# Patient Record
Sex: Male | Born: 1980
Health system: Southern US, Community
[De-identification: ages and names within clinical notes are randomized; demographics above are authoritative.]

## PROBLEM LIST (undated history)

## (undated) DIAGNOSIS — E039 Hypothyroidism, unspecified: Secondary | ICD-10-CM

## (undated) DIAGNOSIS — K219 Gastro-esophageal reflux disease without esophagitis: Secondary | ICD-10-CM

## (undated) DIAGNOSIS — G43909 Migraine, unspecified, not intractable, without status migrainosus: Secondary | ICD-10-CM

## (undated) DIAGNOSIS — E215 Disorder of parathyroid gland, unspecified: Secondary | ICD-10-CM

## (undated) DIAGNOSIS — Z21 Asymptomatic human immunodeficiency virus [HIV] infection status: Secondary | ICD-10-CM

## (undated) DIAGNOSIS — Z9889 Other specified postprocedural states: Secondary | ICD-10-CM

## (undated) DIAGNOSIS — R112 Nausea with vomiting, unspecified: Secondary | ICD-10-CM

## (undated) DIAGNOSIS — E7800 Pure hypercholesterolemia, unspecified: Secondary | ICD-10-CM

## (undated) DIAGNOSIS — D849 Immunodeficiency, unspecified: Secondary | ICD-10-CM

## (undated) DIAGNOSIS — G40909 Epilepsy, unspecified, not intractable, without status epilepticus: Secondary | ICD-10-CM

## (undated) DIAGNOSIS — B20 Human immunodeficiency virus [HIV] disease: Principal | ICD-10-CM

## (undated) DIAGNOSIS — N189 Chronic kidney disease, unspecified: Secondary | ICD-10-CM

## (undated) DIAGNOSIS — I10 Essential (primary) hypertension: Secondary | ICD-10-CM

## (undated) HISTORY — DX: Pure hypercholesterolemia, unspecified: E78.00

## (undated) HISTORY — DX: Gastro-esophageal reflux disease without esophagitis: K21.9

## (undated) HISTORY — DX: Epilepsy, unspecified, not intractable, without status epilepticus: G40.909

## (undated) HISTORY — PX: AV FISTULA PLACEMENT: SHX1204

## (undated) HISTORY — DX: Disorder of parathyroid gland, unspecified: E21.5

## (undated) HISTORY — DX: Migraine, unspecified, not intractable, without status migrainosus: G43.909

## (undated) HISTORY — DX: Hypothyroidism, unspecified: E03.9

## (undated) HISTORY — DX: Human immunodeficiency virus (HIV) disease: B20

---

## 2015-09-09 ENCOUNTER — Other Ambulatory Visit: Payer: Self-pay

## 2015-09-09 ENCOUNTER — Other Ambulatory Visit: Payer: Medicare Other

## 2015-09-09 DIAGNOSIS — Z111 Encounter for screening for respiratory tuberculosis: Secondary | ICD-10-CM

## 2015-09-09 DIAGNOSIS — Z79899 Other long term (current) drug therapy: Secondary | ICD-10-CM

## 2015-09-09 DIAGNOSIS — B20 Human immunodeficiency virus [HIV] disease: Secondary | ICD-10-CM

## 2015-09-09 DIAGNOSIS — Z113 Encounter for screening for infections with a predominantly sexual mode of transmission: Secondary | ICD-10-CM

## 2015-09-09 LAB — CBC WITH DIFFERENTIAL/PLATELET
BASOS PCT: 0 %
Basophils Absolute: 0 cells/uL (ref 0–200)
EOS PCT: 3 %
Eosinophils Absolute: 186 cells/uL (ref 15–500)
HEMATOCRIT: 41.1 % (ref 38.5–50.0)
HEMOGLOBIN: 13.7 g/dL (ref 13.2–17.1)
LYMPHS ABS: 1798 {cells}/uL (ref 850–3900)
Lymphocytes Relative: 29 %
MCH: 30.7 pg (ref 27.0–33.0)
MCHC: 33.3 g/dL (ref 32.0–36.0)
MCV: 92.2 fL (ref 80.0–100.0)
MONO ABS: 310 {cells}/uL (ref 200–950)
MPV: 10 fL (ref 7.5–12.5)
Monocytes Relative: 5 %
NEUTROS ABS: 3906 {cells}/uL (ref 1500–7800)
NEUTROS PCT: 63 %
Platelets: 158 10*3/uL (ref 140–400)
RBC: 4.46 MIL/uL (ref 4.20–5.80)
RDW: 19.2 % — ABNORMAL HIGH (ref 11.0–15.0)
WBC: 6.2 10*3/uL (ref 3.8–10.8)

## 2015-09-10 ENCOUNTER — Telehealth: Payer: Self-pay | Admitting: *Deleted

## 2015-09-10 LAB — HEPATITIS B SURFACE ANTIBODY,QUALITATIVE: Hep B S Ab: POSITIVE — AB

## 2015-09-10 LAB — COMPLETE METABOLIC PANEL WITH GFR
ALT: 15 U/L (ref 9–46)
AST: 19 U/L (ref 10–40)
Albumin: 4.7 g/dL (ref 3.6–5.1)
Alkaline Phosphatase: 319 U/L — ABNORMAL HIGH (ref 40–115)
BUN: 46 mg/dL — AB (ref 7–25)
CALCIUM: 8.6 mg/dL (ref 8.6–10.3)
CO2: 20 mmol/L (ref 20–31)
CREATININE: 12.15 mg/dL — AB (ref 0.60–1.35)
Chloride: 95 mmol/L — ABNORMAL LOW (ref 98–110)
GFR, Est African American: 5 mL/min — ABNORMAL LOW (ref >=60)
GFR, Est Non African American: 5 mL/min — ABNORMAL LOW (ref >=60)
GLUCOSE: 63 mg/dL — AB (ref 65–99)
POTASSIUM: 4.1 mmol/L (ref 3.5–5.3)
SODIUM: 137 mmol/L (ref 135–146)
TOTAL PROTEIN: 9.3 g/dL — AB (ref 6.1–8.1)
Total Bilirubin: 0.4 mg/dL (ref 0.2–1.2)

## 2015-09-10 LAB — LIPID PANEL
CHOLESTEROL: 206 mg/dL — AB (ref 125–200)
HDL: 38 mg/dL — AB (ref >=40)
LDL Cholesterol: 93 mg/dL (ref <130)
TRIGLYCERIDES: 375 mg/dL — AB (ref <150)
Total CHOL/HDL Ratio: 5.4 Ratio — ABNORMAL HIGH (ref <=5.0)
VLDL: 75 mg/dL — AB (ref <30)

## 2015-09-10 LAB — HEPATITIS B CORE ANTIBODY, TOTAL: HEP B C TOTAL AB: REACTIVE — AB

## 2015-09-10 LAB — HEPATITIS B SURFACE ANTIGEN: Hepatitis B Surface Ag: NEGATIVE

## 2015-09-10 LAB — HIV-1 RNA ULTRAQUANT REFLEX TO GENTYP+
HIV 1 RNA Quant: <20 copies/mL (ref <20)
HIV-1 RNA Quant, Log: <1.3 Log copies/mL (ref <1.30)

## 2015-09-10 LAB — HEPATITIS A ANTIBODY, TOTAL: Hep A Total Ab: REACTIVE — AB

## 2015-09-10 LAB — HEPATITIS C ANTIBODY: HCV Ab: NEGATIVE

## 2015-09-10 LAB — RPR

## 2015-09-10 LAB — T-HELPER CELL (CD4) - (RCID CLINIC ONLY)
CD4 % Helper T Cell: 29 % — ABNORMAL LOW (ref 33–55)
CD4 T CELL ABS: 530 /uL (ref 400–2700)

## 2015-09-10 NOTE — Telephone Encounter (Signed)
-----   Message from Randall Hissornelius N Van Dam, MD sent at 09/10/2015 11:07 AM EDT ----- If this guy is not already a dialysis patient he needs to go to the ER IMMEDIATELY!

## 2015-09-10 NOTE — Telephone Encounter (Signed)
Received lab results from 09/09/15 and the patient has a critical high of Creat 12.15. Called Dr Daiva EvesVan Dam who advised if the patient is not on dialysis he will need to go to the ED. As the patient is a transfer patient asked Janyth Contesammy King RN and was advised he is a dialysis patient. Reported finding to Dr Daiva EvesVan Dam.  Called the patient and he does go to dialysis regularly as scheduled and will let them know what his levels are.

## 2015-09-11 LAB — QUANTIFERON TB GOLD ASSAY (BLOOD)
INTERFERON GAMMA RELEASE ASSAY: NEGATIVE
MITOGEN-NIL SO: 9.33 [IU]/mL
Quantiferon Nil Value: 0.08 IU/mL

## 2015-09-15 ENCOUNTER — Ambulatory Visit: Payer: Self-pay | Admitting: Family

## 2015-09-15 ENCOUNTER — Emergency Department (HOSPITAL_COMMUNITY)
Admission: EM | Admit: 2015-09-15 | Discharge: 2015-09-15 | Disposition: A | Discharge status: Home / Self Care | Payer: Medicare Other | Attending: Emergency Medicine | Admitting: Emergency Medicine

## 2015-09-15 ENCOUNTER — Emergency Department (HOSPITAL_COMMUNITY): Payer: Medicare Other

## 2015-09-15 ENCOUNTER — Encounter (HOSPITAL_COMMUNITY): Payer: Self-pay

## 2015-09-15 DIAGNOSIS — R110 Nausea: Secondary | ICD-10-CM | POA: Diagnosis not present

## 2015-09-15 DIAGNOSIS — Z87448 Personal history of other diseases of urinary system: Secondary | ICD-10-CM | POA: Insufficient documentation

## 2015-09-15 DIAGNOSIS — I129 Hypertensive chronic kidney disease with stage 1 through stage 4 chronic kidney disease, or unspecified chronic kidney disease: Secondary | ICD-10-CM | POA: Insufficient documentation

## 2015-09-15 DIAGNOSIS — R51 Headache: Secondary | ICD-10-CM | POA: Insufficient documentation

## 2015-09-15 DIAGNOSIS — F17210 Nicotine dependence, cigarettes, uncomplicated: Secondary | ICD-10-CM | POA: Insufficient documentation

## 2015-09-15 DIAGNOSIS — R55 Syncope and collapse: Secondary | ICD-10-CM | POA: Insufficient documentation

## 2015-09-15 DIAGNOSIS — R4182 Altered mental status, unspecified: Secondary | ICD-10-CM | POA: Diagnosis present

## 2015-09-15 DIAGNOSIS — B20 Human immunodeficiency virus [HIV] disease: Secondary | ICD-10-CM | POA: Diagnosis not present

## 2015-09-15 DIAGNOSIS — N189 Chronic kidney disease, unspecified: Secondary | ICD-10-CM | POA: Diagnosis not present

## 2015-09-15 DIAGNOSIS — H53149 Visual discomfort, unspecified: Secondary | ICD-10-CM | POA: Insufficient documentation

## 2015-09-15 DIAGNOSIS — R410 Disorientation, unspecified: Secondary | ICD-10-CM | POA: Diagnosis not present

## 2015-09-15 DIAGNOSIS — Z992 Dependence on renal dialysis: Secondary | ICD-10-CM | POA: Insufficient documentation

## 2015-09-15 DIAGNOSIS — R519 Headache, unspecified: Secondary | ICD-10-CM

## 2015-09-15 DIAGNOSIS — R42 Dizziness and giddiness: Secondary | ICD-10-CM | POA: Diagnosis not present

## 2015-09-15 HISTORY — DX: Essential (primary) hypertension: I10

## 2015-09-15 HISTORY — DX: Immunodeficiency, unspecified: D84.9

## 2015-09-15 HISTORY — DX: Human immunodeficiency virus (HIV) disease: B20

## 2015-09-15 HISTORY — DX: Asymptomatic human immunodeficiency virus (hiv) infection status: Z21

## 2015-09-15 LAB — COMPREHENSIVE METABOLIC PANEL
ALK PHOS: 257 U/L — AB (ref 38–126)
ALT: 14 U/L — AB (ref 17–63)
AST: 24 U/L (ref 15–41)
Albumin: 4 g/dL (ref 3.5–5.0)
Anion gap: 18 — ABNORMAL HIGH (ref 5–15)
BUN: 21 mg/dL — ABNORMAL HIGH (ref 6–20)
CALCIUM: 8.3 mg/dL — AB (ref 8.9–10.3)
CO2: 24 mmol/L (ref 22–32)
CREATININE: 9.37 mg/dL — AB (ref 0.61–1.24)
Chloride: 95 mmol/L — ABNORMAL LOW (ref 101–111)
GFR calc non Af Amer: 6 mL/min — ABNORMAL LOW (ref >60)
GFR, EST AFRICAN AMERICAN: 7 mL/min — AB (ref >60)
GLUCOSE: 108 mg/dL — AB (ref 65–99)
Potassium: 3 mmol/L — ABNORMAL LOW (ref 3.5–5.1)
SODIUM: 137 mmol/L (ref 135–145)
Total Bilirubin: 0.8 mg/dL (ref 0.3–1.2)
Total Protein: 8.1 g/dL (ref 6.5–8.1)

## 2015-09-15 LAB — CBC
HCT: 38.2 % — ABNORMAL LOW (ref 39.0–52.0)
Hemoglobin: 13.2 g/dL (ref 13.0–17.0)
MCH: 30.8 pg (ref 26.0–34.0)
MCHC: 34.6 g/dL (ref 30.0–36.0)
MCV: 89.3 fL (ref 78.0–100.0)
PLATELETS: 190 10*3/uL (ref 150–400)
RBC: 4.28 MIL/uL (ref 4.22–5.81)
RDW: 17 % — AB (ref 11.5–15.5)
WBC: 6.4 10*3/uL (ref 4.0–10.5)

## 2015-09-15 LAB — CBG MONITORING, ED: GLUCOSE-CAPILLARY: 104 mg/dL — AB (ref 65–99)

## 2015-09-15 MED ORDER — PROCHLORPERAZINE EDISYLATE 5 MG/ML IJ SOLN
10.0000 mg | Freq: Once | INTRAMUSCULAR | Status: AC
  Administered 2015-09-15: 10 mg via INTRAVENOUS
  Filled 2015-09-15: qty 2

## 2015-09-15 MED ORDER — DIPHENHYDRAMINE HCL 50 MG/ML IJ SOLN
25.0000 mg | Freq: Once | INTRAMUSCULAR | Status: AC
  Administered 2015-09-15: 25 mg via INTRAVENOUS
  Filled 2015-09-15: qty 1

## 2015-09-15 NOTE — ED Notes (Signed)
Patient left at this time with all belongings. 

## 2015-09-15 NOTE — ED Provider Notes (Signed)
CSN: [id number]     Arrival date & time 09/15/15  1925 History   First MD Initiated Contact with Patient 09/15/15 1941     Chief Complaint  Patient presents with  . Altered Mental Status     (Consider location/radiation/quality/duration/timing/severity/associated sxs/prior Treatment) HPI  35 year old male presents with an acute headache and syncope at 6 PM. Patient felt lightheaded about 30 minutes after getting home from dialysis. Then developed a headache. A few minutes later he passed out. He was out for 2-3 minutes. He has been nauseated since waking up in headache has continued to worsen. EMS gave him 4 mg Zofran which helped the nausea some. He has been somewhat confused. No fevers. Patient has a history of chronic kidney disease on dialysis, hypertension, and HIV. Currently rates the headache as severe. Family gave him 2 Aleve prior to arrival. No neck stiffness. Headache is frontal. No seizure like activity  Past Medical History  Diagnosis Date  . Renal disorder   . Hypertension   . Immune deficiency disorder (HCC)   . HIV (human immunodeficiency virus infection) (HCC)    History reviewed. No pertinent past surgical history. History reviewed. No pertinent family history. Social History  Substance Use Topics  . Smoking status: Current Every Day Smoker -- 0.50 packs/day    Types: Cigarettes  . Smokeless tobacco: None  . Alcohol Use: No    Review of Systems  Constitutional: Negative for fever.  Gastrointestinal: Positive for nausea. Negative for vomiting.  Neurological: Positive for syncope, light-headedness and headaches. Negative for weakness and numbness.  Psychiatric/Behavioral: Positive for confusion.  All other systems reviewed and are negative.     Allergies  Review of patient's allergies indicates no known allergies.  Home Medications   Prior to Admission medications   Not on File   BP 114/59 mmHg  Pulse 97  Temp(Src) 98.8 F (37.1 C) (Oral)  Resp 16   Ht 5\' 4"  (1.626 m)  Wt 175 lb (79.379 kg)  BMI 30.02 kg/m2 Physical Exam  Constitutional: He appears well-developed and well-nourished.  HENT:  Head: Normocephalic and atraumatic.  Right Ear: External ear normal.  Left Ear: External ear normal.  Nose: Nose normal.  Eyes: EOM are normal. Pupils are equal, round, and reactive to light. Right eye exhibits no discharge. Left eye exhibits no discharge.  photophobia  Neck: Normal range of motion. Neck supple.  No stiffness appreciated  Cardiovascular: Normal rate, regular rhythm, normal heart sounds and intact distal pulses.   Pulmonary/Chest: Effort normal and breath sounds normal.  Abdominal: Soft. There is no tenderness.  Musculoskeletal: He exhibits no edema.  Neurological: He is alert. He is disoriented (thinks it's march and cannot recall year).  Equal strength in all 4 extremities. Normal finger to nose. CN 2-12 grossly intact.   Skin: Skin is warm and dry.  Nursing note and vitals reviewed.   ED Course  Procedures (including critical care time) Labs Review Labs Reviewed  COMPREHENSIVE METABOLIC PANEL - Abnormal; Notable for the following:    Potassium 3.0 (*)    Chloride 95 (*)    Glucose, Bld 108 (*)    BUN 21 (*)    Creatinine, Ser 9.37 (*)    Calcium 8.3 (*)    ALT 14 (*)    Alkaline Phosphatase 257 (*)    GFR calc non Af Amer 6 (*)    GFR calc Af Amer 7 (*)    Anion gap 18 (*)    All other components within  normal limits  CBC - Abnormal; Notable for the following:    HCT 38.2 (*)    RDW 17.0 (*)    All other components within normal limits  CBG MONITORING, ED - Abnormal; Notable for the following:    Glucose-Capillary 104 (*)    All other components within normal limits    Imaging Review Ct Head Wo Contrast  09/15/2015  CLINICAL DATA:  Altered mental status. Dizziness and nausea. Confusion. EXAM: CT HEAD WITHOUT CONTRAST TECHNIQUE: Contiguous axial images were obtained from the base of the skull through the  vertex without intravenous contrast. COMPARISON:  None. FINDINGS: No intracranial hemorrhage, mass effect, or midline shift. No hydrocephalus. The basilar cisterns are patent. No evidence of territorial infarct. No intracranial fluid collection. Atherosclerosis of skullbase vasculature, advanced for age. Calvarium is intact. Included paranasal sinuses and mastoid air cells are well aerated. IMPRESSION: 1.  No acute intracranial abnormality. 2. Skullbase atherosclerosis, advanced for age. Electronically Signed   By: Rubye Oaks M.D.   On: 09/15/2015 20:45   I have personally reviewed and evaluated these images and lab results as part of my medical decision-making.   EKG Interpretation None      MDM   Final diagnoses:  Frontal headache    Patient with acute headache and syncope. History is somewhat limited because of the patient's headache. After he was given Compazine and Benadryl his headache has significantly improved. Headache is now mild. Further history was gained from the family and patient and apparently he has headaches like this often. A couple months ago he had a headache exactly like this includingthe syncope and confusion part. He had a negative head CT and was observed overnight but no other findings. He has had similar symptoms to this multiple times. Thus I'm much less suspicious of more acute pathology such as subarachnoid hemorrhage, meningitis, encephalitis, or other acute CNS pathology. Discussed importance of following up with his PCP and discussed return precautions.    Pricilla Loveless, MD 09/16/15 [phone]

## 2015-09-15 NOTE — ED Notes (Signed)
Per EMS, pt had dialysis around 1600 today and went back home around 1700. Pt's brother in law states after dialysis at home he became confused and not acting himself. Pt complained of dizziness and nausea. Pt had syncopal episode where he did not respond for 30 minutes. Upon EMS arrival pt was sitting up on the couch and pt wasn't responding appropriately to some questions but oriented to self and place. CBG 114, BP 101/63, HR 92 NSR, RR 24, SP02 100% on RA. No deficits. PERRL. Pt given 4mg  of zofran which relieved nausea.

## 2015-09-16 ENCOUNTER — Encounter: Payer: Self-pay | Admitting: Family

## 2015-09-16 ENCOUNTER — Ambulatory Visit: Payer: Self-pay | Admitting: Internal Medicine

## 2015-09-16 ENCOUNTER — Ambulatory Visit (INDEPENDENT_AMBULATORY_CARE_PROVIDER_SITE_OTHER): Payer: Medicare Other | Admitting: Family

## 2015-09-16 ENCOUNTER — Telehealth: Payer: Self-pay | Admitting: Family

## 2015-09-16 VITALS — HR 80 | Temp 97.6°F | Resp 16 | Ht 69.0 in | Wt 173.0 lb

## 2015-09-16 DIAGNOSIS — K2100 Gastro-esophageal reflux disease with esophagitis, without bleeding: Secondary | ICD-10-CM

## 2015-09-16 DIAGNOSIS — B20 Human immunodeficiency virus [HIV] disease: Secondary | ICD-10-CM

## 2015-09-16 DIAGNOSIS — R569 Unspecified convulsions: Secondary | ICD-10-CM | POA: Diagnosis not present

## 2015-09-16 DIAGNOSIS — G4730 Sleep apnea, unspecified: Secondary | ICD-10-CM

## 2015-09-16 DIAGNOSIS — N186 End stage renal disease: Secondary | ICD-10-CM | POA: Insufficient documentation

## 2015-09-16 DIAGNOSIS — F1210 Cannabis abuse, uncomplicated: Secondary | ICD-10-CM

## 2015-09-16 DIAGNOSIS — K219 Gastro-esophageal reflux disease without esophagitis: Secondary | ICD-10-CM | POA: Insufficient documentation

## 2015-09-16 DIAGNOSIS — R29818 Other symptoms and signs involving the nervous system: Secondary | ICD-10-CM

## 2015-09-16 DIAGNOSIS — F1290 Cannabis use, unspecified, uncomplicated: Secondary | ICD-10-CM

## 2015-09-16 LAB — HLA B*5701: HLA-B 5701 W/RFLX HLA-B HIGH: NEGATIVE

## 2015-09-16 MED ORDER — LEVETIRACETAM 500 MG PO TABS: 500.0000 mg | ORAL_TABLET | Freq: Two times a day (BID) | ORAL | Status: DC

## 2015-09-16 MED ORDER — BUSPIRONE HCL 10 MG PO TABS: 10.0000 mg | ORAL_TABLET | Freq: Two times a day (BID) | ORAL | Status: DC

## 2015-09-16 MED ORDER — RANITIDINE HCL 150 MG PO TABS: 150.0000 mg | ORAL_TABLET | Freq: Every day | ORAL | Status: DC

## 2015-09-16 MED ORDER — SUMATRIPTAN SUCCINATE 100 MG PO TABS: ORAL_TABLET | ORAL | Status: AC

## 2015-09-16 NOTE — Assessment & Plan Note (Signed)
Stable on dialysis. Continue treatment per [facility].

## 2015-09-16 NOTE — Telephone Encounter (Signed)
Tried calling pt to let him know. No answer and VM was full.

## 2015-09-16 NOTE — Patient Instructions (Signed)
Thank you for choosing [facility].  Summary/Instructions:  Please continue to take your medications as prescribed.  Please start he Buspar for anxiety.  Follow up for annual wellness visit at your convenience.   Your prescription(s) have been submitted to your pharmacy or been printed and provided for you. Please take as directed and contact our office if you believe you are having problem(s) with the medication(s) or have any questions.  Referrals have been made during this visit. You should expect to hear back from our schedulers in about 7-10 days in regards to establishing an appointment with the specialists we discussed.   If your symptoms worsen or fail to improve, please contact our office for further instruction, or in case of emergency go directly to the emergency room at the closest medical facility.

## 2015-09-16 NOTE — Assessment & Plan Note (Signed)
Grand mal seizures with most recent about a couple of weeks ago and maintained on Keppra. Continue current dosage of Keppra and refer to neurology for seizure evaluation and management.

## 2015-09-16 NOTE — Progress Notes (Signed)
Pre visit review using our clinic review tool, if applicable. No additional management support is needed unless otherwise documented below in the visit note. 

## 2015-09-16 NOTE — Telephone Encounter (Signed)
Please advise 

## 2015-09-16 NOTE — Progress Notes (Signed)
Subjective:    Patient ID: Robert Bradshaw, male    DOB: [birth year], 35 y.o.   MRN: [id number]  Chief Complaint  Patient presents with  . Establish Care    needs refill of seizure medication and ranitidine, sleep study referral, has been having migraines and has this issues after dialysis, something for nerves    HPI:  Robert Bradshaw is a 35 y.o. male who  has a past medical history of Renal disorder; Hypertension; Immune deficiency disorder (HCC); HIV (human immunodeficiency virus infection) (HCC); GERD (gastroesophageal reflux disease); Migraines; and Parathyroid abnormality (HCC). and presents today for an office visit to establish care.    1.) GERD - Currently maintained on ranitidine. Reports taking the medication regularly without adverse side effects. Symptoms are generally well controlled with the medication.  2.) HIV - Currently maintained on Epivir, VIread, and Sustiva. Reports taking the medication as prescribed and denies adverse side effects. Currently managed by Dr. Synthia Innocent of [facility].   3.) End Stage Renal - Previously diangosed with end-stage renal disease about 12 years ago and has been on dialysis since. He is currently managed on a Tuesday-Thursday-Saturday schedule. He is managed by [facility]. Graft is located in his right forearm and is function well.   4.) Need for Sleep Study - Associated symptom of snoring at night, daytime sleepiness and fatigue has been going on for quite a while. He does fall asleep doing regular activities. [facility] recommends a sleep study.  5.) Seizures - Previously diagnosed with seizures about 4 months ago. There is concern that the seizures are coming from his parathyroid. He is currently maintained on Keppra. Reports taking the medication as prescribed and denies adverse side effects. Last seizure was about 1 week ago. Seizures described consistent with Grand mal.    No Known Allergies   Outpatient Prescriptions Prior to  Visit  Medication Sig Dispense Refill  . atorvastatin (LIPITOR) 20 MG tablet Take 20 mg by mouth at bedtime.    . benzonatate (TESSALON) 200 MG capsule Take 200 mg by mouth 3 (three) times daily as needed for cough.    . cinacalcet (SENSIPAR) 60 MG tablet Take 120 mg by mouth at bedtime.    Marland Kitchen efavirenz (SUSTIVA) 600 MG tablet Take 600 mg by mouth at bedtime.    Marland Kitchen lamiVUDine (EPIVIR) 150 MG tablet Take 150 mg by mouth 2 (two) times a week.    . Multiple Vitamins-Iron (MULTIVITAMINS WITH IRON) TABS tablet Take 1 tablet by mouth daily.    . ondansetron (ZOFRAN-ODT) 4 MG disintegrating tablet Take 4 mg by mouth every 8 (eight) hours as needed for nausea or vomiting.    Marland Kitchen oxyCODONE-acetaminophen (PERCOCET/ROXICET) 5-325 MG tablet Take 2 tablets by mouth every 4 (four) hours as needed for severe pain.    . promethazine (PHENERGAN) 25 MG tablet Take 25 mg by mouth every 6 (six) hours as needed for nausea or vomiting.    Marland Kitchen tenofovir (VIREAD) 300 MG tablet Take 300 mg by mouth once a week.    . ranitidine (ZANTAC) 150 MG tablet Take 150 mg by mouth daily.  0   No facility-administered medications prior to visit.     Past Medical History  Diagnosis Date  . Renal disorder   . Hypertension   . Immune deficiency disorder (HCC)   . HIV (human immunodeficiency virus infection) (HCC)   . GERD (gastroesophageal reflux disease)   . Migraines   . Parathyroid abnormality (HCC)  Past Surgical History  Procedure Laterality Date  . Av fistula placement       Family History  Problem Relation Age of Onset  . Hypertension Mother   . Diabetes Mother   . Hypertension Maternal Grandfather      Social History   Social History  . Marital Status: Married    Spouse Name: N/A  . Number of Children: 0  . Years of Education: 10   Occupational History  . Disability    Social History Main Topics  . Smoking status: Current Every Day Smoker -- 0.50 packs/day for 20 years    Types: Cigarettes  .  Smokeless tobacco: Not on file  . Alcohol Use: No  . Drug Use: 7.00 per week    Special: Marijuana  . Sexual Activity: Not on file   Other Topics Concern  . Not on file   Social History Narrative   Fun: Fish and hunt     Review of Systems  Constitutional: Negative for fever and chills.  Respiratory: Negative for chest tightness and shortness of breath.   Cardiovascular: Negative for chest pain, palpitations and leg swelling.  Neurological: Positive for seizures and headaches. Negative for dizziness and tremors.      Objective:    Pulse 80  Temp(Src) 97.6 F (36.4 C) (Oral)  Resp 16  Ht 5\' 9"  (1.753 m)  Wt 173 lb (78.472 kg)  BMI 25.54 kg/m2  SpO2 97% Nursing note and vital signs reviewed.  Physical Exam  Constitutional: He is oriented to person, place, and time. He appears well-developed and well-nourished. No distress.  Distinct odor of marijuana present.   Eyes: Conjunctivae and EOM are normal. Pupils are equal, round, and reactive to light.  Cardiovascular: Normal rate, regular rhythm, normal heart sounds and intact distal pulses.   Pulmonary/Chest: Effort normal and breath sounds normal.  Neurological: He is alert and oriented to person, place, and time. He displays normal reflexes. No cranial nerve deficit. Coordination normal.  Skin: Skin is warm and dry.  Psychiatric: He has a normal mood and affect. His behavior is normal. Judgment and thought content normal.       Assessment & Plan:   Problem List Items Addressed This Visit      Digestive   GERD (gastroesophageal reflux disease)    Stable with current regimen and no adverse side effects. Continue current dosage of ranitidine.      Relevant Medications   ranitidine (ZANTAC) 150 MG tablet     Genitourinary   End stage renal disease (HCC) - Primary    Stable on dialysis. Continue treatment per [facility].        Other   Seizures (HCC)    Grand mal seizures with most recent about a couple of  weeks ago and maintained on Keppra. Continue current dosage of Keppra and refer to neurology for seizure evaluation and management.       Relevant Medications   levETIRAcetam (KEPPRA) 500 MG tablet   Other Relevant Orders   Ambulatory referral to Neurology   HIV disease (HCC)    Stable and currently managed through infectious disease. Continue current dosage of Epivir, Viread, and Susitiva with changes per Infectious Disease.       Marijuana use       I have changed Mr. Lynelle DoctorWright's ranitidine and levETIRAcetam. I am also having him start on busPIRone. Additionally, I am having him maintain his lamiVUDine, efavirenz, benzonatate, atorvastatin, ondansetron, tenofovir, multivitamins with iron, promethazine, oxyCODONE-acetaminophen, and cinacalcet.  Meds ordered this encounter  Medications  . DISCONTD: levETIRAcetam (KEPPRA) 500 MG tablet    Sig: Take 500 mg by mouth 2 (two) times daily.  . ranitidine (ZANTAC) 150 MG tablet    Sig: Take 1 tablet (150 mg total) by mouth daily.    Dispense:  90 tablet    Refill:  1    Order Specific Question:  Supervising Provider    Answer:  Hillard Danker A [[id number]]  . levETIRAcetam (KEPPRA) 500 MG tablet    Sig: Take 1 tablet (500 mg total) by mouth 2 (two) times daily.    Dispense:  60 tablet    Refill:  2    Order Specific Question:  Supervising Provider    Answer:  Hillard Danker A [[id number]]  . busPIRone (BUSPAR) 10 MG tablet    Sig: Take 1 tablet (10 mg total) by mouth 2 (two) times daily.    Dispense:  60 tablet    Refill:  0    Order Specific Question:  Supervising Provider    Answer:  Hillard Danker A [[id number]]     Follow-up: Return in about 3 months (around 12/16/2015).  Jeanine Luz, FNP

## 2015-09-16 NOTE — Telephone Encounter (Signed)
Imitrex will be sent to pharmacy.

## 2015-09-16 NOTE — Assessment & Plan Note (Signed)
Stable with current regimen and no adverse side effects. Continue current dosage of ranitidine.

## 2015-09-16 NOTE — Assessment & Plan Note (Signed)
Stable and currently managed through infectious disease. Continue current dosage of Epivir, Viread, and Susitiva with changes per Infectious Disease.

## 2015-09-16 NOTE — Telephone Encounter (Signed)
Pt came back into office stating he needs something for headaches/migraines.

## 2015-09-18 ENCOUNTER — Telehealth: Payer: Self-pay | Admitting: Family

## 2015-09-18 NOTE — Telephone Encounter (Signed)
Office notes have been faxed over.

## 2015-09-18 NOTE — Telephone Encounter (Signed)
Is requesting last OV to be faxed over.  States referred patient to our office and is needing notes today.

## 2015-09-22 ENCOUNTER — Emergency Department (HOSPITAL_COMMUNITY): Payer: Medicare Other

## 2015-09-22 ENCOUNTER — Observation Stay (HOSPITAL_COMMUNITY)
Admission: EM | Admit: 2015-09-22 | Discharge: 2015-09-23 | Discharge status: Against Medical Advice | Payer: Medicare Other | Attending: Internal Medicine | Admitting: Internal Medicine

## 2015-09-22 ENCOUNTER — Encounter (HOSPITAL_COMMUNITY): Payer: Self-pay | Admitting: Emergency Medicine

## 2015-09-22 DIAGNOSIS — G43909 Migraine, unspecified, not intractable, without status migrainosus: Secondary | ICD-10-CM | POA: Diagnosis not present

## 2015-09-22 DIAGNOSIS — D649 Anemia, unspecified: Secondary | ICD-10-CM | POA: Diagnosis not present

## 2015-09-22 DIAGNOSIS — F17210 Nicotine dependence, cigarettes, uncomplicated: Secondary | ICD-10-CM | POA: Diagnosis not present

## 2015-09-22 DIAGNOSIS — K920 Hematemesis: Secondary | ICD-10-CM | POA: Diagnosis not present

## 2015-09-22 DIAGNOSIS — R05 Cough: Secondary | ICD-10-CM | POA: Insufficient documentation

## 2015-09-22 DIAGNOSIS — B20 Human immunodeficiency virus [HIV] disease: Secondary | ICD-10-CM | POA: Diagnosis not present

## 2015-09-22 DIAGNOSIS — K219 Gastro-esophageal reflux disease without esophagitis: Secondary | ICD-10-CM | POA: Diagnosis not present

## 2015-09-22 DIAGNOSIS — Z992 Dependence on renal dialysis: Secondary | ICD-10-CM | POA: Diagnosis not present

## 2015-09-22 DIAGNOSIS — Z86718 Personal history of other venous thrombosis and embolism: Secondary | ICD-10-CM | POA: Diagnosis not present

## 2015-09-22 DIAGNOSIS — E8889 Other specified metabolic disorders: Secondary | ICD-10-CM | POA: Insufficient documentation

## 2015-09-22 DIAGNOSIS — R000 Tachycardia, unspecified: Secondary | ICD-10-CM | POA: Insufficient documentation

## 2015-09-22 DIAGNOSIS — K210 Gastro-esophageal reflux disease with esophagitis: Secondary | ICD-10-CM | POA: Diagnosis not present

## 2015-09-22 DIAGNOSIS — E214 Other specified disorders of parathyroid gland: Secondary | ICD-10-CM | POA: Insufficient documentation

## 2015-09-22 DIAGNOSIS — R569 Unspecified convulsions: Secondary | ICD-10-CM | POA: Insufficient documentation

## 2015-09-22 DIAGNOSIS — Z21 Asymptomatic human immunodeficiency virus [HIV] infection status: Secondary | ICD-10-CM | POA: Diagnosis not present

## 2015-09-22 DIAGNOSIS — Z8249 Family history of ischemic heart disease and other diseases of the circulatory system: Secondary | ICD-10-CM | POA: Diagnosis not present

## 2015-09-22 DIAGNOSIS — J069 Acute upper respiratory infection, unspecified: Secondary | ICD-10-CM

## 2015-09-22 DIAGNOSIS — Z5321 Procedure and treatment not carried out due to patient leaving prior to being seen by health care provider: Secondary | ICD-10-CM | POA: Insufficient documentation

## 2015-09-22 DIAGNOSIS — N186 End stage renal disease: Secondary | ICD-10-CM | POA: Diagnosis not present

## 2015-09-22 DIAGNOSIS — I120 Hypertensive chronic kidney disease with stage 5 chronic kidney disease or end stage renal disease: Secondary | ICD-10-CM | POA: Diagnosis not present

## 2015-09-22 DIAGNOSIS — K259 Gastric ulcer, unspecified as acute or chronic, without hemorrhage or perforation: Secondary | ICD-10-CM | POA: Diagnosis not present

## 2015-09-22 DIAGNOSIS — E785 Hyperlipidemia, unspecified: Secondary | ICD-10-CM | POA: Insufficient documentation

## 2015-09-22 DIAGNOSIS — F329 Major depressive disorder, single episode, unspecified: Secondary | ICD-10-CM | POA: Insufficient documentation

## 2015-09-22 DIAGNOSIS — I8500 Esophageal varices without bleeding: Secondary | ICD-10-CM | POA: Diagnosis not present

## 2015-09-22 LAB — PROTIME-INR
INR: 1.09 (ref 0.00–1.49)
Prothrombin Time: 14.3 seconds (ref 11.6–15.2)

## 2015-09-22 LAB — CBC WITH DIFFERENTIAL/PLATELET
Basophils Absolute: 0 10*3/uL (ref 0.0–0.1)
Basophils Relative: 0 %
Eosinophils Absolute: 0.2 10*3/uL (ref 0.0–0.7)
Eosinophils Relative: 2 %
HCT: 33.8 % — ABNORMAL LOW (ref 39.0–52.0)
Hemoglobin: 11.4 g/dL — ABNORMAL LOW (ref 13.0–17.0)
Lymphocytes Relative: 24 %
Lymphs Abs: 1.5 10*3/uL (ref 0.7–4.0)
MCH: 30.7 pg (ref 26.0–34.0)
MCHC: 33.7 g/dL (ref 30.0–36.0)
MCV: 91.1 fL (ref 78.0–100.0)
Monocytes Absolute: 0.4 10*3/uL (ref 0.1–1.0)
Monocytes Relative: 7 %
Neutro Abs: 4.2 10*3/uL (ref 1.7–7.7)
Neutrophils Relative %: 67 %
Platelets: 147 10*3/uL — ABNORMAL LOW (ref 150–400)
RBC: 3.71 MIL/uL — ABNORMAL LOW (ref 4.22–5.81)
RDW: 16.8 % — ABNORMAL HIGH (ref 11.5–15.5)
WBC: 6.3 10*3/uL (ref 4.0–10.5)

## 2015-09-22 LAB — CBC
HEMATOCRIT: 33.7 % — AB (ref 39.0–52.0)
HEMOGLOBIN: 11.3 g/dL — AB (ref 13.0–17.0)
MCH: 29.8 pg (ref 26.0–34.0)
MCHC: 33.5 g/dL (ref 30.0–36.0)
MCV: 88.9 fL (ref 78.0–100.0)
Platelets: 148 10*3/uL — ABNORMAL LOW (ref 150–400)
RBC: 3.79 MIL/uL — ABNORMAL LOW (ref 4.22–5.81)
RDW: 16.7 % — ABNORMAL HIGH (ref 11.5–15.5)
WBC: 6.7 10*3/uL (ref 4.0–10.5)

## 2015-09-22 LAB — COMPREHENSIVE METABOLIC PANEL
ALT: 15 U/L — ABNORMAL LOW (ref 17–63)
AST: 18 U/L (ref 15–41)
Albumin: 3.4 g/dL — ABNORMAL LOW (ref 3.5–5.0)
Alkaline Phosphatase: 219 U/L — ABNORMAL HIGH (ref 38–126)
Anion gap: 22 — ABNORMAL HIGH (ref 5–15)
BUN: 77 mg/dL — ABNORMAL HIGH (ref 6–20)
CO2: 21 mmol/L — ABNORMAL LOW (ref 22–32)
Calcium: 7.4 mg/dL — ABNORMAL LOW (ref 8.9–10.3)
Chloride: 98 mmol/L — ABNORMAL LOW (ref 101–111)
Creatinine, Ser: 24.23 mg/dL — ABNORMAL HIGH (ref 0.61–1.24)
GFR calc Af Amer: 2 mL/min — ABNORMAL LOW (ref >60)
GFR calc non Af Amer: 2 mL/min — ABNORMAL LOW (ref >60)
Glucose, Bld: 86 mg/dL (ref 65–99)
Potassium: 4.1 mmol/L (ref 3.5–5.1)
Sodium: 141 mmol/L (ref 135–145)
Total Bilirubin: 0.6 mg/dL (ref 0.3–1.2)
Total Protein: 7.5 g/dL (ref 6.5–8.1)

## 2015-09-22 LAB — MAGNESIUM: Magnesium: 2.6 mg/dL — ABNORMAL HIGH (ref 1.7–2.4)

## 2015-09-22 LAB — PHOSPHORUS: Phosphorus: 9.4 mg/dL — ABNORMAL HIGH (ref 2.5–4.6)

## 2015-09-22 MED ORDER — SODIUM CHLORIDE 0.9% FLUSH
3.0000 mL | Freq: Two times a day (BID) | INTRAVENOUS | Status: DC
  Administered 2015-09-23: 3 mL via INTRAVENOUS

## 2015-09-22 MED ORDER — ONDANSETRON HCL 4 MG PO TABS: 4.0000 mg | ORAL_TABLET | Freq: Four times a day (QID) | ORAL | Status: DC | PRN

## 2015-09-22 MED ORDER — SODIUM CHLORIDE 0.9 % IV SOLN
8.0000 mg/h | INTRAVENOUS | Status: DC
  Administered 2015-09-23 (×2): 8 mg/h via INTRAVENOUS
  Filled 2015-09-22 (×4): qty 80

## 2015-09-22 MED ORDER — ALTEPLASE 2 MG IJ SOLR: 2.0000 mg | Freq: Once | INTRAMUSCULAR | Status: DC | PRN

## 2015-09-22 MED ORDER — OXYCODONE-ACETAMINOPHEN 5-325 MG PO TABS
ORAL_TABLET | ORAL | Status: AC
  Filled 2015-09-22: qty 1

## 2015-09-22 MED ORDER — SODIUM CHLORIDE 0.9% FLUSH: 3.0000 mL | INTRAVENOUS | Status: DC | PRN

## 2015-09-22 MED ORDER — LAMIVUDINE 150 MG PO TABS
150.0000 mg | ORAL_TABLET | ORAL | Status: DC
Start: 2015-09-24 — End: 2015-09-23

## 2015-09-22 MED ORDER — OXYCODONE-ACETAMINOPHEN 5-325 MG PO TABS
2.0000 | ORAL_TABLET | ORAL | Status: DC | PRN
  Administered 2015-09-22: 2 via ORAL

## 2015-09-22 MED ORDER — ATORVASTATIN CALCIUM 20 MG PO TABS
20.0000 mg | ORAL_TABLET | Freq: Every day | ORAL | Status: DC
  Administered 2015-09-23: 20 mg via ORAL
  Filled 2015-09-22: qty 1

## 2015-09-22 MED ORDER — SUCROFERRIC OXYHYDROXIDE 500 MG PO CHEW
1000.0000 mg | CHEWABLE_TABLET | Freq: Three times a day (TID) | ORAL | Status: DC
  Filled 2015-09-22 (×3): qty 2

## 2015-09-22 MED ORDER — PENTAFLUOROPROP-TETRAFLUOROETH EX AERO: 1.0000 "application " | INHALATION_SPRAY | CUTANEOUS | Status: DC | PRN

## 2015-09-22 MED ORDER — PROMETHAZINE HCL 25 MG PO TABS
25.0000 mg | ORAL_TABLET | Freq: Four times a day (QID) | ORAL | Status: DC | PRN
Start: 2015-09-22 — End: 2015-09-23

## 2015-09-22 MED ORDER — SODIUM CHLORIDE 0.9% FLUSH: 3.0000 mL | Freq: Two times a day (BID) | INTRAVENOUS | Status: DC

## 2015-09-22 MED ORDER — SALINE SPRAY 0.65 % NA SOLN
1.0000 | NASAL | Status: DC | PRN
  Filled 2015-09-22: qty 44

## 2015-09-22 MED ORDER — BUSPIRONE HCL 10 MG PO TABS
10.0000 mg | ORAL_TABLET | Freq: Two times a day (BID) | ORAL | Status: DC
  Administered 2015-09-23: 10 mg via ORAL
  Filled 2015-09-22 (×4): qty 1

## 2015-09-22 MED ORDER — CINACALCET HCL 30 MG PO TABS
120.0000 mg | ORAL_TABLET | Freq: Every day | ORAL | Status: DC
  Administered 2015-09-23: 120 mg via ORAL
  Filled 2015-09-22: qty 4

## 2015-09-22 MED ORDER — SODIUM CHLORIDE 0.9 % IV SOLN: 250.0000 mL | INTRAVENOUS | Status: DC | PRN

## 2015-09-22 MED ORDER — IPRATROPIUM BROMIDE 0.06 % NA SOLN
2.0000 | Freq: Four times a day (QID) | NASAL | Status: DC | PRN
  Filled 2015-09-22: qty 15

## 2015-09-22 MED ORDER — GUAIFENESIN ER 600 MG PO TB12
600.0000 mg | ORAL_TABLET | Freq: Two times a day (BID) | ORAL | Status: DC
  Administered 2015-09-23: 600 mg via ORAL
  Filled 2015-09-22 (×2): qty 1

## 2015-09-22 MED ORDER — LIDOCAINE-PRILOCAINE 2.5-2.5 % EX CREA: 1.0000 "application " | TOPICAL_CREAM | CUTANEOUS | Status: DC | PRN

## 2015-09-22 MED ORDER — SODIUM CHLORIDE 0.9 % IV SOLN
80.0000 mg | Freq: Once | INTRAVENOUS | Status: AC
  Administered 2015-09-23: 80 mg via INTRAVENOUS
  Filled 2015-09-22: qty 80

## 2015-09-22 MED ORDER — PANTOPRAZOLE SODIUM 40 MG PO TBEC
40.0000 mg | DELAYED_RELEASE_TABLET | Freq: Once | ORAL | Status: AC
  Administered 2015-09-22: 40 mg via ORAL
  Filled 2015-09-22: qty 1

## 2015-09-22 MED ORDER — SUMATRIPTAN SUCCINATE 25 MG PO TABS: 25.0000 mg | ORAL_TABLET | ORAL | Status: DC | PRN

## 2015-09-22 MED ORDER — EFAVIRENZ 600 MG PO TABS
600.0000 mg | ORAL_TABLET | Freq: Every day | ORAL | Status: DC
  Administered 2015-09-23: 600 mg via ORAL
  Filled 2015-09-22 (×3): qty 1

## 2015-09-22 MED ORDER — SODIUM CHLORIDE 0.9 % IV SOLN: 100.0000 mL | INTRAVENOUS | Status: DC | PRN

## 2015-09-22 MED ORDER — CALCIUM CARBONATE ANTACID 500 MG PO CHEW
1.0000 | CHEWABLE_TABLET | Freq: Three times a day (TID) | ORAL | Status: DC
  Administered 2015-09-23: 200 mg via ORAL
  Filled 2015-09-22 (×2): qty 1

## 2015-09-22 MED ORDER — TENOFOVIR DISOPROXIL FUMARATE 300 MG PO TABS: 300.0000 mg | ORAL_TABLET | ORAL | Status: DC

## 2015-09-22 MED ORDER — ONDANSETRON HCL 4 MG/2ML IJ SOLN: 4.0000 mg | Freq: Four times a day (QID) | INTRAMUSCULAR | Status: DC | PRN

## 2015-09-22 MED ORDER — HEPARIN SODIUM (PORCINE) 1000 UNIT/ML DIALYSIS: 1000.0000 [IU] | INTRAMUSCULAR | Status: DC | PRN

## 2015-09-22 MED ORDER — LIDOCAINE HCL (PF) 1 % IJ SOLN: 5.0000 mL | INTRAMUSCULAR | Status: DC | PRN

## 2015-09-22 MED ORDER — LEVETIRACETAM 500 MG PO TABS
500.0000 mg | ORAL_TABLET | Freq: Two times a day (BID) | ORAL | Status: DC
  Administered 2015-09-23: 500 mg via ORAL
  Filled 2015-09-22 (×2): qty 1

## 2015-09-22 MED ORDER — DIALYVITE 3000 3 MG PO TABS: 1.0000 | ORAL_TABLET | Freq: Every day | ORAL | Status: DC

## 2015-09-22 MED ORDER — PANTOPRAZOLE SODIUM 40 MG IV SOLR: 40.0000 mg | Freq: Two times a day (BID) | INTRAVENOUS | Status: DC

## 2015-09-22 MED ORDER — RENA-VITE PO TABS
1.0000 | ORAL_TABLET | Freq: Every day | ORAL | Status: DC
  Filled 2015-09-22: qty 1

## 2015-09-22 NOTE — ED Notes (Signed)
Pt states he does not have any urine output.

## 2015-09-22 NOTE — ED Notes (Signed)
Pt reports that he woke up this morning and drank some orange juice and immediatly vomited bright red blood. Pt denies nausea at this time. Pt is a Dialysis pt and is due for it today. Pt alert x4.

## 2015-09-22 NOTE — Progress Notes (Addendum)
Arrival Method: via stretcher  Mental Status: alert and oriented x 4 Telemetry: applied and CCMD notified, verified by Morrie SheldonAshley, NT Skin: assessment completed with charge RN Tubes: n/a IV: l hand, NSL Pain: 10/10 headache Family: mom at bedside Living Situation: home with family  Safety Measures: Pt refusing bed alarm. Pt denies recent falls but family at bedside states patient was recently seen in ED for fall last week. Pt provided with non skid socks but refused as well. Call bell in reach and bed in lowest position. Pt encouraged to call before ambulating 6E Orientation: oriented to staff and unit

## 2015-09-22 NOTE — Progress Notes (Signed)
Patient signed off treatment ama. Patient complained of access hurting. Noted slightly edematous, good blood flow and good blood return. No s/s infiltration noted. Treatment d/c with ice pack and pain med given  Elevated on pillow. Received 3.45 hour of 4 hour tx . 1474 ml removed of 3.5 l goal. Dr. Darrick Pennadeterding notified

## 2015-09-22 NOTE — ED Notes (Signed)
Report given to Consulting civil engineerCharge RN on 6E

## 2015-09-22 NOTE — ED Notes (Signed)
PT REFUSES TO HAVE OCCULT BLOOD COLLECTED. CHRIS LAWYER MADE AWARE.

## 2015-09-22 NOTE — ED Provider Notes (Signed)
CSN: [id number]     Arrival date & time 09/22/15  1034 History   First MD Initiated Contact with Patient 09/22/15 1038     Chief Complaint  Patient presents with  . Hematemesis     (Consider location/radiation/quality/duration/timing/severity/associated sxs/prior Treatment) HPI Patient presents to the emergency department with hematemesis that occurred this morning.  The patient has not had any abdominal pain or bloody stools.  The patient is due for dialysis today.  EMS and fire report that there is a large amount of blood on their arrival to the house. The patient denies chest pain, shortness of breath, headache,blurred vision, neck pain, fever, cough, weakness, numbness, dizziness, anorexia, edema, abdominal pain, nausea, vomiting, diarrhea, rash, back pain, dysuria, bloody stool, near syncope, or syncope.  Patient states nothing seems make the condition better or worse Past Medical History  Diagnosis Date  . Renal disorder   . Hypertension   . Immune deficiency disorder (HCC)   . HIV (human immunodeficiency virus infection) (HCC)   . GERD (gastroesophageal reflux disease)   . Migraines   . Parathyroid abnormality [facility])    Past Surgical History  Procedure Laterality Date  . Av fistula placement     Family History  Problem Relation Age of Onset  . Hypertension Mother   . Diabetes Mother   . Hypertension Maternal Grandfather    Social History  Substance Use Topics  . Smoking status: Current Every Day Smoker -- 0.50 packs/day for 20 years    Types: Cigarettes  . Smokeless tobacco: None  . Alcohol Use: No    Review of Systems  All other systems negative except as documented in the HPI. All pertinent positives and negatives as reviewed in the HPI.  Allergies  Review of patient's allergies indicates no known allergies.  Home Medications   Prior to Admission medications   Medication Sig Start Date End Date Taking? Authorizing Provider  atorvastatin (LIPITOR) 20 MG tablet  Take 20 mg by mouth at bedtime.   Yes Historical Provider, MD  B Complex-C-Folic Acid (RENA-VITE PO) Take 1 tablet by mouth daily.   Yes Historical Provider, MD  benzonatate (TESSALON) 200 MG capsule Take 200 mg by mouth 3 (three) times daily as needed for cough.   Yes Historical Provider, MD  busPIRone (BUSPAR) 10 MG tablet Take 1 tablet (10 mg total) by mouth 2 (two) times daily. 09/16/15  Yes Veryl Speak, FNP  cinacalcet (SENSIPAR) 60 MG tablet Take 120 mg by mouth at bedtime.   Yes Historical Provider, MD  efavirenz (SUSTIVA) 600 MG tablet Take 600 mg by mouth at bedtime.   Yes Historical Provider, MD  folic acid-vitamin b complex-vitamin c-selenium-zinc (DIALYVITE) 3 MG TABS tablet Take 1 tablet by mouth daily.   Yes Historical Provider, MD  lamiVUDine (EPIVIR) 150 MG tablet Take 150 mg by mouth 2 (two) times a week.   Yes Historical Provider, MD  levETIRAcetam (KEPPRA) 500 MG tablet Take 1 tablet (500 mg total) by mouth 2 (two) times daily. 09/16/15  Yes Veryl Speak, FNP  Multiple Vitamins-Iron (MULTIVITAMINS WITH IRON) TABS tablet Take 1 tablet by mouth daily.   Yes Historical Provider, MD  ondansetron (ZOFRAN-ODT) 4 MG disintegrating tablet Take 4 mg by mouth every 8 (eight) hours as needed for nausea or vomiting.   Yes Historical Provider, MD  oxyCODONE-acetaminophen (PERCOCET/ROXICET) 5-325 MG tablet Take 2 tablets by mouth every 4 (four) hours as needed for severe pain.   Yes Historical Provider, MD  promethazine (PHENERGAN)  25 MG tablet Take 25 mg by mouth every 6 (six) hours as needed for nausea or vomiting.   Yes Historical Provider, MD  ranitidine (ZANTAC) 150 MG tablet Take 1 tablet (150 mg total) by mouth daily. Patient taking differently: Take 150 mg by mouth 2 (two) times daily.  09/16/15  Yes Veryl Speak, FNP  tenofovir (VIREAD) 300 MG tablet Take 300 mg by mouth once a week.   Yes Historical Provider, MD  SUMAtriptan (IMITREX) 100 MG tablet Take 0.5-1 tablets by mouth  at the onset of a headache. May repeat in 2 hours if headache persists or recurs. Patient not taking: Reported on 09/22/2015 09/16/15   Veryl Speak, FNP   BP 119/78 mmHg  Pulse 92  Temp(Src) 98.4 F (36.9 C) (Oral)  Resp 16  SpO2 94% Physical Exam  Constitutional: He is oriented to person, place, and time. He appears well-developed and well-nourished. No distress.  HENT:  Head: Normocephalic and atraumatic.  Mouth/Throat: Oropharynx is clear and moist.  Eyes: Pupils are equal, round, and reactive to light.  Neck: Normal range of motion. Neck supple.  Cardiovascular: Normal rate, regular rhythm and normal heart sounds.  Exam reveals no gallop and no friction rub.   No murmur heard. Pulmonary/Chest: Effort normal and breath sounds normal. No respiratory distress. He has no wheezes.  Abdominal: Soft. Bowel sounds are normal. He exhibits no distension. There is no tenderness.  Neurological: He is alert and oriented to person, place, and time. He exhibits normal muscle tone. Coordination normal.  Skin: Skin is warm and dry. No rash noted. No erythema.  Psychiatric: He has a normal mood and affect. His behavior is normal.  Nursing note and vitals reviewed.   ED Course  Procedures (including critical care time) Labs Review Labs Reviewed  COMPREHENSIVE METABOLIC PANEL - Abnormal; Notable for the following:    Chloride 98 (*)    CO2 21 (*)    BUN 77 (*)    Creatinine, Ser 24.23 (*)    Calcium 7.4 (*)    Albumin 3.4 (*)    ALT 15 (*)    Alkaline Phosphatase 219 (*)    GFR calc non Af Amer 2 (*)    GFR calc Af Amer 2 (*)    Anion gap 22 (*)    All other components within normal limits  CBC WITH DIFFERENTIAL/PLATELET - Abnormal; Notable for the following:    RBC 3.71 (*)    Hemoglobin 11.4 (*)    HCT 33.8 (*)    RDW 16.8 (*)    Platelets 147 (*)    All other components within normal limits  POC OCCULT BLOOD, ED    Imaging Review Dg Chest 2 View  09/22/2015  CLINICAL DATA:   Cough, vomiting bright red blood EXAM: CHEST  2 VIEW COMPARISON:  None. FINDINGS: The heart size and mediastinal contours are within normal limits. Both lungs are clear. The visualized skeletal structures are unremarkable. IMPRESSION: No active cardiopulmonary disease. Electronically Signed   By: Elige Ko   On: 09/22/2015 12:57   I have personally reviewed and evaluated these images and lab results as part of my medical decision-making.   EKG Interpretation   Date/Time:  Tuesday Sep 22 2015 10:41:07 EDT Ventricular Rate:  101 PR Interval:  175 QRS Duration: 83 QT Interval:  357 QTC Calculation: 463 R Axis:   59 Text Interpretation:  Sinus tachycardia Confirmed by Rubin Payor  MD, NATHAN  [phone]) on 09/22/2015 2:08:39 PM  MDM   Final diagnoses:  None   Patient will need admission for serial hemoglobins and his dialysis.  The patient has many chronic medical problems complicating his course.  She has been otherwise stable here in the emergency department.  Will speak with the [facility] about admission   Charlestine NightChristopher Othell Jaime, PA-C 09/22/15 1537  Benjiman CoreNathan Pickering, MD 09/23/15 [phone]

## 2015-09-22 NOTE — Progress Notes (Signed)
Pt complaining of 10/10 headache. MD notified via text page. No PRNs. Will await orders

## 2015-09-22 NOTE — H&P (Signed)
History and Physical    Robert Bradshaw WUJ:[id number] DOB: [birth year] DOA: 09/22/2015  Referring MD/NP/PA: Ebbie Ridge - PA PCP: Jeanine Luz, FNP  Outpatient Specialists: ID Patient coming from: Home  Chief Complaint: Hematemesis   HPI: Robert Bradshaw is a 35 y.o. male with medical history significant of HIV, ESRD on dialysis, HTN, migraines, parathyroid dysfunction presenting after a single episode of large-volume hematemesis. Seen by EMS and firefighters Who endorsed patient's history.  Patient states that he awoke this morning in his normal state of health and has had no recent complaints of any associated symptoms such as fevers, chest pain, short of breath, palpitations, diarrhea, constipation, dysuria, frequency, back pain, rash, neck stiffness, headache. This morning patient drank some orange juice. Short time thereafter he developed nausea and a single bout of emesis was bloody. Patient states that it was nothing but blood and filled up approximately one 12 ounce cup worth of blood. Patient called EMS and the fire department who saw the blood. Patient otherwise felt in his normal state of health while here in the ED he had another episode of hematemesis approximately silver dollar in size. Appeared to be clotted blood. Patient states has been compliant with his HIV medications. Patient does endorse slight URI symptoms over the last day or so involving nasal and sinus congestion.  Last full dialysis 1 week ago. Patient had partial dialysis on Thursday and Saturday.   ED Course: Given Protonix in ED. labs show significant metabolic derangements. 2 g drop in hemoglobin.  Review of Systems: As per HPI otherwise 10 point review of systems negative.   Past Medical History  Diagnosis Date  . Renal disorder   . Hypertension   . Immune deficiency disorder (HCC)   . HIV (human immunodeficiency virus infection) (HCC)   . GERD (gastroesophageal reflux disease)   . Migraines   . Parathyroid  abnormality [name]     Past Surgical History  Procedure Laterality Date  . Av fistula placement       reports that he has been smoking Cigarettes.  He has a 10 pack-year smoking history. He does not have any smokeless tobacco history on file. He reports that he uses illicit drugs (Marijuana) about 7 times per week. He reports that he does not drink alcohol.  No Known Allergies  Family History  Problem Relation Age of Onset  . Hypertension Mother   . Diabetes Mother   . Hypertension Maternal Grandfather     Prior to Admission medications   Medication Sig Start Date End Date Taking? Authorizing Provider  atorvastatin (LIPITOR) 20 MG tablet Take 20 mg by mouth at bedtime.   Yes Historical Provider, MD  B Complex-C-Folic Acid (RENA-VITE PO) Take 1 tablet by mouth daily.   Yes Historical Provider, MD  benzonatate (TESSALON) 200 MG capsule Take 200 mg by mouth 3 (three) times daily as needed for cough.   Yes Historical Provider, MD  busPIRone (BUSPAR) 10 MG tablet Take 1 tablet (10 mg total) by mouth 2 (two) times daily. 09/16/15  Yes Veryl Speak, FNP  cinacalcet (SENSIPAR) 60 MG tablet Take 120 mg by mouth at bedtime.   Yes Historical Provider, MD  efavirenz (SUSTIVA) 600 MG tablet Take 600 mg by mouth at bedtime.   Yes Historical Provider, MD  folic acid-vitamin b complex-vitamin c-selenium-zinc (DIALYVITE) 3 MG TABS tablet Take 1 tablet by mouth daily.   Yes Historical Provider, MD  lamiVUDine (EPIVIR) 150 MG tablet Take 150 mg by mouth 2 (two) times  a week.   Yes Historical Provider, MD  levETIRAcetam (KEPPRA) 500 MG tablet Take 1 tablet (500 mg total) by mouth 2 (two) times daily. 09/16/15  Yes Veryl SpeakGregory D Calone, FNP  Multiple Vitamins-Iron (MULTIVITAMINS WITH IRON) TABS tablet Take 1 tablet by mouth daily.   Yes Historical Provider, MD  ondansetron (ZOFRAN-ODT) 4 MG disintegrating tablet Take 4 mg by mouth every 8 (eight) hours as needed for nausea or vomiting.   Yes Historical  Provider, MD  oxyCODONE-acetaminophen (PERCOCET/ROXICET) 5-325 MG tablet Take 2 tablets by mouth every 4 (four) hours as needed for severe pain.   Yes Historical Provider, MD  promethazine (PHENERGAN) 25 MG tablet Take 25 mg by mouth every 6 (six) hours as needed for nausea or vomiting.   Yes Historical Provider, MD  ranitidine (ZANTAC) 150 MG tablet Take 1 tablet (150 mg total) by mouth daily. Patient taking differently: Take 150 mg by mouth 2 (two) times daily.  09/16/15  Yes Veryl SpeakGregory D Calone, FNP  tenofovir (VIREAD) 300 MG tablet Take 300 mg by mouth once a week.   Yes Historical Provider, MD  SUMAtriptan (IMITREX) 100 MG tablet Take 0.5-1 tablets by mouth at the onset of a headache. May repeat in 2 hours if headache persists or recurs. Patient not taking: Reported on 09/22/2015 09/16/15   Veryl SpeakGregory D Calone, FNP    Physical Exam: Filed Vitals:   09/22/15 1315 09/22/15 1400 09/22/15 1443 09/22/15 1500  BP: 137/99 130/88 119/78 144/79  Pulse: 87  92 87  Temp:      TempSrc:      Resp:   16 16  SpO2: 95%  94% 93%      Constitutional: NAD, calm, comfortable Filed Vitals:   09/22/15 1315 09/22/15 1400 09/22/15 1443 09/22/15 1500  BP: 137/99 130/88 119/78 144/79  Pulse: 87  92 87  Temp:      TempSrc:      Resp:   16 16  SpO2: 95%  94% 93%   Eyes:  PERRL, lids and conjunctivae normal ENMT:  Moist mixed membranes, pharynx clear, boggy nasal turbinates with L>R  Neck:  normal, supple, no masses, no thyromegaly Respiratory:  clear to auscultation bilaterally, no wheezing, no crackles. Normal respiratory effort. No accessory muscle use.  Cardiovascular:  Regular rate and rhythm, no murmurs / rubs / gallops. No extremity edema. 2+ pedal pulses. No carotid bruits.  Abdomen:  no tenderness, no masses palpated. No hepatosplenomegaly. Bowel sounds positive.  Musculoskeletal:  no clubbing / cyanosis. No joint deformity upper and lower extremities. Good ROM, no contractures. Normal muscle tone.    Skin:  no rashes, lesions, ulcers. No induration Neurologic:  CN 2-12 grossly intact. Sensation intact, Strength 5/5 in all 4.  Psychiatric:  Normal judgment and insight. Alert and oriented x 3. Normal mood.    Labs on Admission: I have personally reviewed following labs and imaging studies  CBC:  Recent Labs Lab 09/15/15 2002 09/22/15 1139  WBC 6.4 6.3  NEUTROABS  --  4.2  HGB 13.2 11.4*  HCT 38.2* 33.8*  MCV 89.3 91.1  PLT 190 147*   Basic Metabolic Panel:  Recent Labs Lab 09/15/15 2002 09/22/15 1139  NA 137 141  K 3.0* 4.1  CL 95* 98*  CO2 24 21*  GLUCOSE 108* 86  BUN 21* 77*  CREATININE 9.37* 24.23*  CALCIUM 8.3* 7.4*   GFR: Estimated Creatinine Clearance: 4.2 mL/min (by C-G formula based on Cr of 24.23). Liver Function Tests:  Recent Labs Lab  09/15/15 2002 09/22/15 1139  AST 24 18  ALT 14* 15*  ALKPHOS 257* 219*  BILITOT 0.8 0.6  PROT 8.1 7.5  ALBUMIN 4.0 3.4*   No results for input(s): LIPASE, AMYLASE in the last 168 hours. No results for input(s): AMMONIA in the last 168 hours. Coagulation Profile: No results for input(s): INR, PROTIME in the last 168 hours. Cardiac Enzymes: No results for input(s): CKTOTAL, CKMB, CKMBINDEX, TROPONINI in the last 168 hours. BNP (last 3 results) No results for input(s): PROBNP in the last 8760 hours. HbA1C: No results for input(s): HGBA1C in the last 72 hours. CBG:  Recent Labs Lab 09/15/15 1931  GLUCAP 104*   Lipid Profile: No results for input(s): CHOL, HDL, LDLCALC, TRIG, CHOLHDL, LDLDIRECT in the last 72 hours. Thyroid Function Tests: No results for input(s): TSH, T4TOTAL, FREET4, T3FREE, THYROIDAB in the last 72 hours. Anemia Panel: No results for input(s): VITAMINB12, FOLATE, FERRITIN, TIBC, IRON, RETICCTPCT in the last 72 hours. Urine analysis: No results found for: COLORURINE, APPEARANCEUR, LABSPEC, PHURINE, GLUCOSEU, HGBUR, BILIRUBINUR, KETONESUR, PROTEINUR, UROBILINOGEN, NITRITE,  LEUKOCYTESUR  Creatinine Clearance: Estimated Creatinine Clearance: 4.2 mL/min (by C-G formula based on Cr of 24.23).  Sepsis Labs: (procalcitonin:4,lacticidven:4) )No results found for this or any previous visit (from the past 240 hour(s)).   Radiological Exams on Admission: Dg Chest 2 View  09/22/2015  CLINICAL DATA:  Cough, vomiting bright red blood EXAM: CHEST  2 VIEW COMPARISON:  None. FINDINGS: The heart size and mediastinal contours are within normal limits. Both lungs are clear. The visualized skeletal structures are unremarkable. IMPRESSION: No active cardiopulmonary disease. Electronically Signed   By: Elige Ko   On: 09/22/2015 12:57    Assessment/Plan Principal Problem:   Hematemesis Active Problems:   End stage renal disease (HCC)   Seizures (HCC)   GERD (gastroesophageal reflux disease)   HIV disease (HCC)   URI (upper respiratory infection)   Hematemesis: 2 episodes of frank bright red hematemasis. Hgb down to 11.4 from 13.2. No other symptoms. Suspect upper GI bleed from gastric ulceration. BUN Cr ratio elevated but unreliable due to ESRD/dialysis. NSAID use. Discussed w/ [facility], Dr. Ewing Schlein who has agreed to see the pt in consultation.  - Tele - f/u GI recs - NPO after midnight - IVF - PPI drip - CBC Q12 - FOBT - coags  ESRD on dialysis: last full session of dialysis 1 week ago. Renal aware and will take pt to dialysis - DIalysis per nephrology   HIV: states compliance w/ HIV regimen. Followed by [facility] in ID clinic. Labs from 09/09/2015 showing HIV quadrant less than 20 and CD4 count 530. - continue efavirenz, Epivir, tenofovir  URI: - NS nasal rinse, intranasal atrovent, mucinex  Seizure: last szr 1 wk ago - continue Keppra  Depression: Continue Buspar  HLD: - continue statin  Migraines: - continue home imitrex  DVT prophylaxis: SCD  Code Status: FULL  Family Communication: mother and wife  Disposition Plan: pending improvement   Consults called: [facility], Nephrology - Dialysis  Admission status: Observation.    MERRELL, DAVID J MD [facility]  If 7PM-7AM, please contact night-coverage [email or web address] Password [email or web address]  09/22/2015, 5:30 PM

## 2015-09-22 NOTE — Consult Note (Signed)
[facility] Renal Consultation Note    Indication for Consultation:  Management of ESRD/hemodialysis; anemia, hypertension/volume and secondary hyperparathyroidism PCP:  HPI: Robert Bradshaw is a 35 y.o. male with ESRD disease on hemodialysis since 2005 D/T HTN at [facility]. Past medical history significant for HIV, GERD, migraines, anemia of chronic disease, hyperparathyroidism, H/O  R Subclavian Vein "Occlusion "  per Venogram feb 2017, history of AVG infection (GPC) 08/18/15 and 09/08/15, recent perm cath removal.   Patient has been in usual state of health, traveled this past weekend to [facility] He said he only had two hours of hemodialysis Saturday because he developed chest pain (localized to L chest) and signed off early. States he feels like he has been coming down with cold. Today he was getting dressed for hemodialysis and began coughing. He states he vomited "a whole of blood everywhere". EMS was called and he was brought to ED for evaluation. On arrival to ED T 98.4  BP 144/85 HR 75 RR 22 O2 sats 98% on RA. Labs on arrival Scr 24.23 BUN 77 C02 21 K+ 4.1 ALK Phos 219 WBC 6.3 HGB 11.4 HCT 33.8 Plt 147. CXR unremarkable. He is being admitted for evaluation of  hematemesis and for hemodialysis as Scr is now 24.23.   Patient is awake, alert, oriented X 3. Mother-in-law at bedside. Patient C/O HA and pressure in maxillary/sphenoid sinus area. No chest pain since 09/19/15. No history of cardiac disease. Denies fever, chills, nausea, + vomiting + Hematemesis, no melena, hematochrezia, no C/O abdominal pain. Denies SOB/DOE, orthopnea, no changes in vision/hearing, no syncope, dizziness.  Patient has HD at [facility]. Has history of early sign offs/high intradialyic weight gain/medical noncompliance. Most recent labs for HD center: WBC 6.8 Hgb 13.2 HCT 41.9 PLT 169 Ferritin 1142 Iron 72 UIBC 142 TIBC 214 Tsat 34% Ca  8.2 C Ca 7.9 Phos 7.7 PTH 2419 Mg 2.1 (09/17/15) Patient has been  prescribed Auryxia hectoral, sensipar, for PTH/PHOS suppression, TUMS for hypocalcemia. He has been receiving weekly venofer 50 mg IV for anemia.     Past Medical History  Diagnosis Date  . Renal disorder   . Hypertension   . Immune deficiency disorder ([place])   . HIV (human immunodeficiency virus infection) ([place])   . GERD (gastroesophageal reflux disease)   . Migraines   . Parathyroid abnormality [facility])    Past Surgical History  Procedure Laterality Date  . Av fistula placement     Family History  Problem Relation Age of Onset  . Hypertension Mother   . Diabetes Mother   . Hypertension Maternal Grandfather    Social History:  reports that he has been smoking Cigarettes.  He has a 10 pack-year smoking history. He does not have any smokeless tobacco history on file. He reports that he uses illicit drugs (Marijuana) about 7 times per week. He reports that he does not drink alcohol. No Known Allergies Prior to Admission medications   Medication Sig Start Date End Date Taking? Authorizing Provider  atorvastatin (LIPITOR) 20 MG tablet Take 20 mg by mouth at bedtime.   Yes Historical Provider, MD  B Complex-C-Folic Acid (RENA-VITE PO) Take 1 tablet by mouth daily.   Yes Historical Provider, MD  benzonatate (TESSALON) 200 MG capsule Take 200 mg by mouth 3 (three) times daily as needed for cough.   Yes Historical Provider, MD  busPIRone (BUSPAR) 10 MG tablet Take 1 tablet (10 mg total) by mouth 2 (two) times daily. 09/16/15  Yes Golden Circle, FNP  cinacalcet (SENSIPAR) 60 MG tablet Take 120 mg by mouth at bedtime.   Yes Historical Provider, MD  efavirenz (SUSTIVA) 600 MG tablet Take 600 mg by mouth at bedtime.   Yes Historical Provider, MD  folic acid-vitamin b complex-vitamin c-selenium-zinc (DIALYVITE) 3 MG TABS tablet Take 1 tablet by mouth daily.   Yes Historical Provider, MD  lamiVUDine (EPIVIR) 150 MG tablet Take 150 mg by mouth 2 (two) times a week.   Yes Historical Provider, MD   levETIRAcetam (KEPPRA) 500 MG tablet Take 1 tablet (500 mg total) by mouth 2 (two) times daily. 09/16/15  Yes Golden Circle, FNP  Multiple Vitamins-Iron (MULTIVITAMINS WITH IRON) TABS tablet Take 1 tablet by mouth daily.   Yes Historical Provider, MD  ondansetron (ZOFRAN-ODT) 4 MG disintegrating tablet Take 4 mg by mouth every 8 (eight) hours as needed for nausea or vomiting.   Yes Historical Provider, MD  oxyCODONE-acetaminophen (PERCOCET/ROXICET) 5-325 MG tablet Take 2 tablets by mouth every 4 (four) hours as needed for severe pain.   Yes Historical Provider, MD  promethazine (PHENERGAN) 25 MG tablet Take 25 mg by mouth every 6 (six) hours as needed for nausea or vomiting.   Yes Historical Provider, MD  ranitidine (ZANTAC) 150 MG tablet Take 1 tablet (150 mg total) by mouth daily. Patient taking differently: Take 150 mg by mouth 2 (two) times daily.  09/16/15  Yes Golden Circle, FNP  tenofovir (VIREAD) 300 MG tablet Take 300 mg by mouth once a week.   Yes Historical Provider, MD  SUMAtriptan (IMITREX) 100 MG tablet Take 0.5-1 tablets by mouth at the onset of a headache. May repeat in 2 hours if headache persists or recurs. Patient not taking: Reported on 09/22/2015 09/16/15   Golden Circle, FNP   No current facility-administered medications for this encounter.   Labs: Basic Metabolic Panel:  Recent Labs Lab 09/15/15 2002 09/22/15 1139  NA 137 141  K 3.0* 4.1  CL 95* 98*  CO2 24 21*  GLUCOSE 108* 86  BUN 21* 77*  CREATININE 9.37* 24.23*  CALCIUM 8.3* 7.4*   Liver Function Tests:  Recent Labs Lab 09/15/15 2002 09/22/15 1139  AST 24 18  ALT 14* 15*  ALKPHOS 257* 219*  BILITOT 0.8 0.6  PROT 8.1 7.5  ALBUMIN 4.0 3.4*   CBC:  Recent Labs Lab 09/15/15 2002 09/22/15 1139  WBC 6.4 6.3  NEUTROABS  --  4.2  HGB 13.2 11.4*  HCT 38.2* 33.8*  MCV 89.3 91.1  PLT 190 147*   CBG:  Recent Labs Lab 09/15/15 1931  GLUCAP 104*   Iron Studies: No results for input(s):  IRON, TIBC, TRANSFERRIN, FERRITIN in the last 72 hours. Studies/Results: Dg Chest 2 View  09/22/2015  CLINICAL DATA:  Cough, vomiting bright red blood EXAM: CHEST  2 VIEW COMPARISON:  None. FINDINGS: The heart size and mediastinal contours are within normal limits. Both lungs are clear. The visualized skeletal structures are unremarkable. IMPRESSION: No active cardiopulmonary disease. Electronically Signed   By: Kathreen Devoid   On: 09/22/2015 12:57    ROS: As per HPI otherwise negative.   Physical Exam: Filed Vitals:   09/22/15 1315 09/22/15 1400 09/22/15 1443 09/22/15 1500  BP: 137/99 130/88 119/78 144/79  Pulse: 87  92 87  Temp:      TempSrc:      Resp:   16 16  SpO2: 95%  94% 93%     General: well nourished  male looks older than stated age.  Head: Normocephalic, atraumatic, sclera injected, mucus membranes dry, has periorbital edema present.  Neck: Supple. Mild JVD at 30 degrees.  Lungs: Clear bilaterally to auscultation without wheezes, rales, or rhonchi. Breathing is unlabored. Heart: RRR with S1 S2. No murmurs, rubs, or gallops appreciated. SR on monitor.  Abdomen: Soft, non-tender, non-distended with normoactive bowel sounds. No rebound/guarding. No obvious abdominal masses. M-S:  Strength and tone appear normal for age. Lower extremities:without edema or ischemic changes, no open wounds  Neuro: Alert and oriented X 3. Moves all extremities spontaneously. Psych:  Responds to questions appropriately with a normal affect. Dialysis Access: LFA AVG + bruit  Dialysis Orders: [facility], TTS 4 hrs 15 min 180NRe Optiflux  BFR 400, DFR 800   EDW 77.5 (kg) 2.0 K, 2.25 Ca,  Sodium Model: Linear LFA AVG Heparin 8000 units IV Q treatment Hectoral 4 mcg IV q treatment Venofer 54m IV q treatment  Assessment/Plan: 1.  Hematemesis: per primary. Single episode at home today. HGB stable. No further episodes reported. 2.  [facility].23 BUN 77.  Shorted tx 09/19/15, missed tx today. K+4.1 3.  Hypertension/volume  - No antihypertensive meds on med list. Is not volume overloaded per CXR. Will attempt 2-3 liters as tolerated. No wt yet on pt.  4.  Anemia  - HGB 11.4. No OP ESA. Dc venofer.  5.  Metabolic bone disease - Continue sensipar, substitute velphoro for Auryxia (not on hospital formulary) VDRA and TUMS 6.  Nutrition - Renal Diet/fld restrictions. Renal vit/supplement 7.  HIV: per primary  Rita H. BOwens Shark NP-C 09/22/2015, 5:07 PM  [phone] Pt seen, examined and agree w A/P as above.  RKelly SplinterMD CNewell Rubbermaidpager [phone]   cell [phone]/06/2015, 5:55 PM

## 2015-09-23 ENCOUNTER — Ambulatory Visit: Payer: Self-pay | Admitting: Infectious Disease

## 2015-09-23 ENCOUNTER — Ambulatory Visit: Payer: Medicare Other | Admitting: Infectious Disease

## 2015-09-23 ENCOUNTER — Observation Stay (HOSPITAL_COMMUNITY): Payer: Medicare Other | Admitting: Anesthesiology

## 2015-09-23 ENCOUNTER — Encounter (HOSPITAL_COMMUNITY)
Admission: EM | Discharge status: Against Medical Advice | Payer: Self-pay | Source: Home / Self Care | Attending: Emergency Medicine

## 2015-09-23 ENCOUNTER — Encounter (HOSPITAL_COMMUNITY): Payer: Self-pay

## 2015-09-23 DIAGNOSIS — Z5321 Procedure and treatment not carried out due to patient leaving prior to being seen by health care provider: Secondary | ICD-10-CM | POA: Diagnosis not present

## 2015-09-23 DIAGNOSIS — K920 Hematemesis: Secondary | ICD-10-CM | POA: Diagnosis not present

## 2015-09-23 DIAGNOSIS — I8500 Esophageal varices without bleeding: Secondary | ICD-10-CM | POA: Diagnosis not present

## 2015-09-23 DIAGNOSIS — K259 Gastric ulcer, unspecified as acute or chronic, without hemorrhage or perforation: Secondary | ICD-10-CM | POA: Diagnosis not present

## 2015-09-23 HISTORY — PX: ESOPHAGOGASTRODUODENOSCOPY (EGD) WITH PROPOFOL: SHX5813

## 2015-09-23 LAB — CBC
HCT: 34.7 % — ABNORMAL LOW (ref 39.0–52.0)
HEMATOCRIT: 35.2 % — AB (ref 39.0–52.0)
HEMOGLOBIN: 11.5 g/dL — AB (ref 13.0–17.0)
Hemoglobin: 11.5 g/dL — ABNORMAL LOW (ref 13.0–17.0)
MCH: 29.4 pg (ref 26.0–34.0)
MCH: 29.8 pg (ref 26.0–34.0)
MCHC: 32.7 g/dL (ref 30.0–36.0)
MCHC: 33.1 g/dL (ref 30.0–36.0)
MCV: 90 fL (ref 78.0–100.0)
MCV: 89.9 fL (ref 78.0–100.0)
PLATELETS: 132 10*3/uL — AB (ref 150–400)
Platelets: 150 10*3/uL (ref 150–400)
RBC: 3.91 MIL/uL — ABNORMAL LOW (ref 4.22–5.81)
RBC: 3.86 MIL/uL — AB (ref 4.22–5.81)
RDW: 16.9 % — ABNORMAL HIGH (ref 11.5–15.5)
RDW: 16.6 % — AB (ref 11.5–15.5)
WBC: 5.3 10*3/uL (ref 4.0–10.5)
WBC: 4.3 10*3/uL (ref 4.0–10.5)

## 2015-09-23 LAB — COMPREHENSIVE METABOLIC PANEL
ALBUMIN: 3.4 g/dL — AB (ref 3.5–5.0)
ALK PHOS: 241 U/L — AB (ref 38–126)
ALT: 15 U/L — ABNORMAL LOW (ref 17–63)
AST: 18 U/L (ref 15–41)
Anion gap: 17 — ABNORMAL HIGH (ref 5–15)
BILIRUBIN TOTAL: 0.5 mg/dL (ref 0.3–1.2)
BUN: 39 mg/dL — AB (ref 6–20)
CALCIUM: 8.1 mg/dL — AB (ref 8.9–10.3)
CHLORIDE: 97 mmol/L — AB (ref 101–111)
CO2: 24 mmol/L (ref 22–32)
CREATININE: 16.16 mg/dL — AB (ref 0.61–1.24)
GFR calc Af Amer: 4 mL/min — ABNORMAL LOW (ref >60)
GFR calc non Af Amer: 3 mL/min — ABNORMAL LOW (ref >60)
Glucose, Bld: 87 mg/dL (ref 65–99)
Potassium: 3.5 mmol/L (ref 3.5–5.1)
Sodium: 138 mmol/L (ref 135–145)
Total Protein: 7.5 g/dL (ref 6.5–8.1)

## 2015-09-23 LAB — OCCULT BLOOD X 1 CARD TO LAB, STOOL: Fecal Occult Bld: POSITIVE — AB

## 2015-09-23 LAB — MRSA PCR SCREENING: MRSA by PCR: NEGATIVE

## 2015-09-23 SURGERY — ESOPHAGOGASTRODUODENOSCOPY (EGD) WITH PROPOFOL
Anesthesia: Monitor Anesthesia Care

## 2015-09-23 MED ORDER — SODIUM CHLORIDE 0.9 % IV SOLN
INTRAVENOUS | Status: DC | PRN
  Administered 2015-09-23: 14:00:00 via INTRAVENOUS

## 2015-09-23 MED ORDER — PROPOFOL 10 MG/ML IV BOLUS
INTRAVENOUS | Status: DC | PRN
  Administered 2015-09-23: 130 mg via INTRAVENOUS

## 2015-09-23 MED ORDER — LACTATED RINGERS IV SOLN: INTRAVENOUS | Status: DC

## 2015-09-23 MED ORDER — SODIUM CHLORIDE 0.9 % IV SOLN: INTRAVENOUS | Status: DC

## 2015-09-23 MED ORDER — FERRIC CITRATE 1 GM 210 MG(FE) PO TABS: 630.0000 mg | ORAL_TABLET | Freq: Three times a day (TID) | ORAL | Status: DC

## 2015-09-23 MED ORDER — LAMIVUDINE 10 MG/ML PO SOLN
50.0000 mg | Freq: Every day | ORAL | Status: DC
  Filled 2015-09-23: qty 5

## 2015-09-23 MED ORDER — LAMIVUDINE 150 MG PO TABS: 150.0000 mg | ORAL_TABLET | ORAL | Status: DC

## 2015-09-23 MED ORDER — LIDOCAINE-PRILOCAINE 2.5-2.5 % EX CREA: TOPICAL_CREAM | Freq: Once | CUTANEOUS | Status: DC

## 2015-09-23 NOTE — Anesthesia Preprocedure Evaluation (Addendum)
Anesthesia Evaluation  Patient identified by MRN, date of birth, ID band Patient awake    Reviewed: Allergy & Precautions, NPO status , Patient's Chart, lab work & pertinent test results  Airway Mallampati: I  TM Distance: >3 FB Neck ROM: Full    Dental  (+) Teeth Intact   Pulmonary Current Smoker,    Pulmonary exam normal breath sounds clear to auscultation       Cardiovascular hypertension,  Rhythm:Regular Rate:Normal     Neuro/Psych  Headaches, Seizures -,  negative psych ROS   GI/Hepatic Neg liver ROS, GERD  Medicated,  Endo/Other  negative endocrine ROS  Renal/GU DialysisRenal disease  negative genitourinary   Musculoskeletal negative musculoskeletal ROS (+)   Abdominal   Peds negative pediatric ROS (+)  Hematology negative hematology ROS (+)   Anesthesia Other Findings - HIV   Reproductive/Obstetrics negative OB ROS                           Anesthesia Physical Anesthesia Plan  ASA: III  Anesthesia Plan: MAC   Post-op Pain Management:    Induction: Intravenous  Airway Management Planned: Nasal Cannula  Additional Equipment:   Intra-op Plan:   Post-operative Plan:   Informed Consent: I have reviewed the patients History and Physical, chart, labs and discussed the procedure including the risks, benefits and alternatives for the proposed anesthesia with the patient or authorized representative who has indicated his/her understanding and acceptance.   Dental advisory given  Plan Discussed with: CRNA  Anesthesia Plan Comments:       Anesthesia Quick Evaluation

## 2015-09-23 NOTE — Anesthesia Postprocedure Evaluation (Signed)
Anesthesia Post Note  Patient: Robert Bradshaw  Procedure(s) Performed: Procedure(s) (LRB): ESOPHAGOGASTRODUODENOSCOPY (EGD) WITH PROPOFOL (N/A)  Patient location during evaluation: PACU Anesthesia Type: MAC Level of consciousness: awake and alert Pain management: pain level controlled Vital Signs Assessment: post-procedure vital signs reviewed and stable Respiratory status: spontaneous breathing, nonlabored ventilation, respiratory function stable and patient connected to nasal cannula oxygen Cardiovascular status: stable and blood pressure returned to baseline Anesthetic complications: no    Last Vitals:  Filed Vitals:   09/23/15 1459 09/23/15 1505  BP: 119/28 105/37  Pulse: 106 102  Temp: 36.7 C   Resp: 12 18    Last Pain:  Filed Vitals:   09/23/15 1507  PainSc: 10-Worst pain ever                 [name], [name]

## 2015-09-23 NOTE — Progress Notes (Signed)
PROGRESS NOTE  Robert Bradshaw ZOX:[id number] DOB: Aug 11, 1980 DOA: 09/22/2015 PCP: Jeanine Luz, FNP Outpatient Specialists:      Brief Narrative: 35 y.o. male with medical history significant of HIV, ESRD on dialysis, HTN, migraines, parathyroid dysfunction, who was admitted on 5/2 after an episode of large volume hematemesis. GI was consulted and planned for an EGD on 5/3  Assessment & Plan: Principal Problem:   Hematemesis Active Problems:   End stage renal disease (HCC)   Seizures (HCC)   GERD (gastroesophageal reflux disease)   HIV disease (HCC)   URI (upper respiratory infection)   Hematemesis - Concern for upper GI bleed, patient with 2 episodes of hematemesis. - Hemoglobin down to 11, however overall stable since last night. No hematemesis overnight. - Gastroenterology consulted, discussed with Dr. Ewing Schlein this morning, appreciate input, it appears that he will have an EGD later today - Continue PPI, continue to closely monitor blood counts  ESRD on dialysis - Nephrology consult  HIV - Follows by infectious diseases note patient, last CD4 count last month 530 - Continue home regimen  Seizure disorder - Continue Keppra  Hyperlipidemia - Continue statin  Migraines - Continue home Imitrex    DVT prophylaxis: SCD Code Status: Full Family Communication: d/w significant other bedside Disposition Plan: Home when ready Barriers for discharge: Gastroenterology evaluation  Consultants:   Gastroenterology  Nephrology  Procedures:   HD  Antimicrobials:  None   Subjective: - Patient feels well this morning, denies any chest pain, denies any shortness of breath, has no abdominal pain, nausea, vomiting  Objective: Filed Vitals:   09/22/15 2030 09/22/15 2100 09/22/15 2357 09/23/15 0605  BP: 127/76 124/68 129/67 140/72  Pulse: 102 98 93 104  Temp:   98.5 F (36.9 C) 97.9 F (36.6 C)  TempSrc:   Oral Oral  Resp:   16 18  Height:   5\' 4"  (1.626 m)     Weight:   77.2 kg (170 lb 3.1 oz)   SpO2:   99% 99%    Intake/Output Summary (Last 24 hours) at 09/23/15 1402 Last data filed at 09/23/15 0600  Gross per 24 hour  Intake 127.08 ml  Output   1474 ml  Net -1346.92 ml   Filed Weights   09/22/15 1805 09/22/15 2357  Weight: 85.3 kg (188 lb 0.8 oz) 77.2 kg (170 lb 3.1 oz)    Examination: Constitutional: NAD Filed Vitals:   09/22/15 2030 09/22/15 2100 09/22/15 2357 09/23/15 0605  BP: 127/76 124/68 129/67 140/72  Pulse: 102 98 93 104  Temp:   98.5 F (36.9 C) 97.9 F (36.6 C)  TempSrc:   Oral Oral  Resp:   16 18  Height:   5\' 4"  (1.626 m)   Weight:   77.2 kg (170 lb 3.1 oz)   SpO2:   99% 99%   Respiratory: clear to auscultation bilaterally, no wheezing, no crackles. Normal respiratory effort. No accessory muscle use.  Cardiovascular: Regular rate and rhythm, no murmurs / rubs / gallops. No extremity edema. 2+ pedal pulses. No carotid bruits.  Abdomen: no tenderness. Bowel sounds positive.    Data Reviewed: I have personally reviewed following labs and imaging studies  CBC:  Recent Labs Lab 09/22/15 1139 09/22/15 1832 09/23/15 0502  WBC 6.3 6.7 5.3  NEUTROABS 4.2  --   --   HGB 11.4* 11.3* 11.5*  HCT 33.8* 33.7* 35.2*  MCV 91.1 88.9 90.0  PLT 147* 148* 150   Basic Metabolic Panel:  Recent Labs Lab  09/22/15 1139 09/22/15 1832 09/23/15 0502  NA 141  --  138  K 4.1  --  3.5  CL 98*  --  97*  CO2 21*  --  24  GLUCOSE 86  --  87  BUN 77*  --  39*  CREATININE 24.23*  --  16.16*  CALCIUM 7.4*  --  8.1*  MG  --  2.6*  --   PHOS  --  9.4*  --    GFR: Estimated Creatinine Clearance: 5.9 mL/min (by C-G formula based on Cr of 16.16). Liver Function Tests:  Recent Labs Lab 09/22/15 1139 09/23/15 0502  AST 18 18  ALT 15* 15*  ALKPHOS 219* 241*  BILITOT 0.6 0.5  PROT 7.5 7.5  ALBUMIN 3.4* 3.4*   No results for input(s): LIPASE, AMYLASE in the last 168 hours. No results for input(s): AMMONIA in the last  168 hours. Coagulation Profile:  Recent Labs Lab 09/22/15 1832  INR 1.09   Cardiac Enzymes: No results for input(s): CKTOTAL, CKMB, CKMBINDEX, TROPONINI in the last 168 hours. BNP (last 3 results) No results for input(s): PROBNP in the last 8760 hours. HbA1C: No results for input(s): HGBA1C in the last 72 hours. CBG: No results for input(s): GLUCAP in the last 168 hours. Lipid Profile: No results for input(s): CHOL, HDL, LDLCALC, TRIG, CHOLHDL, LDLDIRECT in the last 72 hours. Thyroid Function Tests: No results for input(s): TSH, T4TOTAL, FREET4, T3FREE, THYROIDAB in the last 72 hours. Anemia Panel: No results for input(s): VITAMINB12, FOLATE, FERRITIN, TIBC, IRON, RETICCTPCT in the last 72 hours. Urine analysis: No results found for: COLORURINE, APPEARANCEUR, LABSPEC, PHURINE, GLUCOSEU, HGBUR, BILIRUBINUR, KETONESUR, PROTEINUR, UROBILINOGEN, NITRITE, LEUKOCYTESUR Sepsis Labs: Invalid input(s): PROCALCITONIN, LACTICIDVEN  Recent Results (from the past 240 hour(s))  MRSA PCR Screening     Status: None   Collection Time: 09/23/15  2:30 AM  Result Value Ref Range Status   MRSA by PCR NEGATIVE NEGATIVE Final    Comment:        The GeneXpert MRSA Assay (FDA approved for NASAL specimens only), is one component of a comprehensive MRSA colonization surveillance program. It is not intended to diagnose MRSA infection nor to guide or monitor treatment for MRSA infections.       Radiology Studies: Dg Chest 2 View  09/22/2015  CLINICAL DATA:  Cough, vomiting bright red blood EXAM: CHEST  2 VIEW COMPARISON:  None. FINDINGS: The heart size and mediastinal contours are within normal limits. Both lungs are clear. The visualized skeletal structures are unremarkable. IMPRESSION: No active cardiopulmonary disease. Electronically Signed   By: Elige Ko   On: 09/22/2015 12:57     Scheduled Meds: . atorvastatin  20 mg Oral QHS  . busPIRone  10 mg Oral BID  . calcium carbonate  1  tablet Oral TID  . cinacalcet  120 mg Oral QHS  . efavirenz  600 mg Oral QHS  . Ferric Citrate  630 mg Oral TID WC  . guaiFENesin  600 mg Oral BID  . [START ON 09/24/2015] lamiVUDine  150 mg Oral Once per day on Mon Thu  . levETIRAcetam  500 mg Oral BID  . lidocaine-prilocaine   Topical Once  . multivitamin  1 tablet Oral QHS  . [START ON 09/26/2015] pantoprazole (PROTONIX) IV  40 mg Intravenous Q12H  . sodium chloride flush  3 mL Intravenous Q12H  . sodium chloride flush  3 mL Intravenous Q12H  . [START ON 09/28/2015] tenofovir  300 mg Oral Q  Mon   Continuous Infusions: . pantoprozole (PROTONIX) infusion 8 mg/hr (09/23/15 1107)    Robert Pertostin Azzam Mehra, MD, PhD [facility] Pager [phone]  If 7PM-7AM, please contact night-coverage [email or web address] Password [email or web address] 09/23/2015, 2:02 PM

## 2015-09-23 NOTE — Care Management Obs Status (Signed)
MEDICARE OBSERVATION STATUS NOTIFICATION   Patient Details  Name: Robert Bradshaw MRN: [id number] Date of Birth: 12/05/1980   Medicare Observation Status Notification Given:  Yes    Durant Scibilia, Annamarie MajorCheryl U, RN 09/23/2015, 11:04 AM

## 2015-09-23 NOTE — Progress Notes (Addendum)
Robert Bradshaw Progress Note   Subjective: no more vomiting , no abd pain  Filed Vitals:   09/22/15 2030 09/22/15 2100 09/22/15 2357 09/23/15 0605  BP: 127/76 124/68 129/67 140/72  Pulse: 102 98 93 104  Temp:   98.5 F (36.9 C) 97.9 F (36.6 C)  TempSrc:   Oral Oral  Resp:   16 18  Height:   5\' 4"  (1.626 m)   Weight:   77.2 kg (170 lb 3.1 oz)   SpO2:   99% 99%    Inpatient medications: . atorvastatin  20 mg Oral QHS  . busPIRone  10 mg Oral BID  . calcium carbonate  1 tablet Oral TID  . cinacalcet  120 mg Oral QHS  . efavirenz  600 mg Oral QHS  . guaiFENesin  600 mg Oral BID  . [START ON 09/24/2015] lamiVUDine  150 mg Oral Once per day on Mon Thu  . levETIRAcetam  500 mg Oral BID  . multivitamin  1 tablet Oral QHS  . [START ON 09/26/2015] pantoprazole (PROTONIX) IV  40 mg Intravenous Q12H  . sodium chloride flush  3 mL Intravenous Q12H  . sodium chloride flush  3 mL Intravenous Q12H  . sucroferric oxyhydroxide  1,000 mg Oral TID WC  . [START ON 09/28/2015] tenofovir  300 mg Oral Q Mon   . pantoprozole (PROTONIX) infusion 8 mg/hr (09/23/15 1107)   sodium chloride, ipratropium, ondansetron **OR** ondansetron (ZOFRAN) IV, oxyCODONE-acetaminophen, promethazine, sodium chloride, sodium chloride flush  Exam: AAM, sonorous breathing, asleep, arouses easily, Ox 3 No jvd Chest clear L , R base dec'd RRR no mrg Abd soft ntnd no mass or ascites L forearm AVG +bruit No LE edema Neuro alert Ox 3  [facility]  4h 15min  77.5kg   2/2.25 bath  LFA AVG   Hep 8000 Hect 4 ug Venofer 50/wk     Assessment: 1. Hematemesis - for EGD today. No active bleed here, Hb ok at 11 2. ESRD since 2005, TTS] 3. Hist of HTN, no meds now 4. MBD cont meds, has high PTH per wife, will need his "glands out" at some point. Is on Auryxia at home , 210 mg 3 ac tid.  Family has meds, not on formulary here 5. HIV  Plan - for EGD today.  HD tomorrow, min UF to dry wt.  Pharm doesn't  have auryxia here, we will fill out home med sheet and use his home meds.  No heparin w HD.    Vinson Moselleob Yasmeen Manka MD [facility] pager [phone]    cell [phone] 09/23/2015, 1:38 PM    Recent Labs Lab 09/22/15 1139 09/22/15 1832 09/23/15 0502  NA 141  --  138  K 4.1  --  3.5  CL 98*  --  97*  CO2 21*  --  24  GLUCOSE 86  --  87  BUN 77*  --  39*  CREATININE 24.23*  --  16.16*  CALCIUM 7.4*  --  8.1*  PHOS  --  9.4*  --     Recent Labs Lab 09/22/15 1139 09/23/15 0502  AST 18 18  ALT 15* 15*  ALKPHOS 219* 241*  BILITOT 0.6 0.5  PROT 7.5 7.5  ALBUMIN 3.4* 3.4*    Recent Labs Lab 09/22/15 1139 09/22/15 1832 09/23/15 0502  WBC 6.3 6.7 5.3  NEUTROABS 4.2  --   --   HGB 11.4* 11.3* 11.5*  HCT 33.8* 33.7* 35.2*  MCV 91.1 88.9 90.0  PLT 147* 148* 150

## 2015-09-23 NOTE — Transfer of Care (Signed)
Immediate Anesthesia Transfer of Care Note  Patient: Robert BernheimJoe Bradshaw  Procedure(s) Performed: Procedure(s): ESOPHAGOGASTRODUODENOSCOPY (EGD) WITH PROPOFOL (N/A)  Patient Location: PACU  Anesthesia Type:General  Level of Consciousness: awake, alert , oriented and patient cooperative  Airway & Oxygen Therapy: Patient Spontanous Breathing  Post-op Assessment: Report given to RN and Post -op Vital signs reviewed and stable  Post vital signs: Reviewed and stable  Last Vitals:  Filed Vitals:   09/23/15 1405 09/23/15 1459  BP: 150/45 119/28  Pulse: 95 106  Temp:  36.7 C  Resp: 13 12    Last Pain:  Filed Vitals:   09/23/15 1500  PainSc: 10-Worst pain ever         Complications: No apparent anesthesia complications

## 2015-09-23 NOTE — Progress Notes (Signed)
Patient and girlfriend in room arguing all day, patient wanting to leave. Patient has refused all medications today, Explained risks of leaving AMA patient stated he didn't care he was going to leave so he could smoke and eat what he wants to, girlfriend called my phone and ask for prescriptions for discharge medications explained to her patient was not being discharged he was leaving AMA and he has already signed himself out. Patient and family instructed to follow up with his PCP for medications and refills. Dr.Gherghe made aware.  Rima Blizzard, Kae HellerMiranda Lynn, RN

## 2015-09-23 NOTE — Op Note (Signed)
[facility] Patient Name: Robert Bradshaw Procedure Date : 09/23/2015 MRN: [id number] Attending MD: Graylin Shiver , MD Date of Birth: 01/09/1981 CSN: [id number] Age: 35 Admit Type: Inpatient Procedure:                Upper GI endoscopy Indications:              Hematemesis Providers:                Graylin Shiver, MD, Will Bonnet, RN Referring MD:              Medicines:                Propofol per Anesthesia Complications:            No immediate complications. Estimated Blood Loss:     Estimated blood loss: none. Procedure:                Pre-Anesthesia Assessment:                           - Prior to the procedure, a History and Physical                            was performed, and patient medications and                            allergies were reviewed. The patient's tolerance of                            previous anesthesia was also reviewed. The risks                            and benefits of the procedure and the sedation                            options and risks were discussed with the patient.                            All questions were answered, and informed consent                            was obtained. Prior Anticoagulants: The patient has                            taken no previous anticoagulant or antiplatelet                            agents. ASA Grade Assessment: III - A patient with                            severe systemic disease. After reviewing the risks                            and benefits, the patient was deemed in  satisfactory condition to undergo the procedure.                           After obtaining informed consent, the endoscope was                            passed under direct vision. Throughout the                            procedure, the patient's blood pressure, pulse, and                            oxygen saturations were monitored continuously. The                            [id number]  ([id number]) scope was introduced through the                            mouth, and advanced to the second part of duodenum.                            The upper GI endoscopy was accomplished without                            difficulty. The patient tolerated the procedure                            well. Scope In: Scope Out: Findings:      Grade I varices were found in the lower third of the esophagus.      One non-bleeding cratered gastric ulcer with no stigmata of bleeding was       found in the prepyloric region of the stomach. The lesion was small in       largest dimension.      The examined duodenum was normal. Impression:               - Grade I esophageal varices.                           - Non-bleeding gastric ulcer with no stigmata of                            bleeding.                           - Normal examined duodenum.                           - No specimens collected. Moderate Sedation:      . Recommendation:           - Resume regular diet.                           - Continue present medications. Procedure Code(s):        --- Professional ---  [id number], Esophagogastroduodenoscopy, flexible,                            transoral; diagnostic, including collection of                            specimen(s) by brushing or washing, when performed                            (separate procedure) Diagnosis Code(s):        --- Professional ---                           I85.00, Esophageal varices without bleeding                           K25.9, Gastric ulcer, unspecified as acute or                            chronic, without hemorrhage or perforation                           K92.0, Hematemesis CPT copyright 2016 [employer]. All rights reserved. The codes documented in this report are preliminary and upon coder review may  be revised to meet current compliance requirements. Herbert MoorsSalem Ganem, MD Graylin ShiverSalem F Ganem, MD 09/23/2015 2:53:58  PM This report has been signed electronically. Number of Addenda: 0

## 2015-09-23 NOTE — Consult Note (Signed)
Subjective:   HPI  The patient is a 35 year old male with multiple medical problems including HIV, end-stage renal failure on dialysis, and hypertension. He was admitted to the hospital because of hematemesis. He states he had hematemesis prior to admission but none since admission. Denies abdominal pain or heartburn.  Review of Systems No chest pain or shortness of breath  Past Medical History  Diagnosis Date  . Renal disorder   . Hypertension   . Immune deficiency disorder (HCC)   . HIV (human immunodeficiency virus infection) (HCC)   . GERD (gastroesophageal reflux disease)   . Migraines   . Parathyroid abnormality [facility])    Past Surgical History  Procedure Laterality Date  . Av fistula placement     Social History   Social History  . Marital Status: Married    Spouse Name: N/A  . Number of Children: 0  . Years of Education: 10   Occupational History  . Disability    Social History Main Topics  . Smoking status: Current Every Day Smoker -- 0.50 packs/day for 20 years    Types: Cigarettes  . Smokeless tobacco: Not on file  . Alcohol Use: No  . Drug Use: 7.00 per week    Special: Marijuana  . Sexual Activity: Not on file   Other Topics Concern  . Not on file   Social History Narrative   Fun: Fish and hunt   family history includes Diabetes in his mother; Hypertension in his maternal grandfather and mother.  Current facility-administered medications:  .  0.9 %  sodium chloride infusion, 250 mL, Intravenous, PRN, Ozella Rocks, MD .  atorvastatin (LIPITOR) tablet 20 mg, 20 mg, Oral, QHS, Ozella Rocks, MD, 20 mg at 09/23/15 0055 .  busPIRone (BUSPAR) tablet 10 mg, 10 mg, Oral, BID, Ozella Rocks, MD, 10 mg at 09/23/15 0156 .  calcium carbonate (TUMS - dosed in mg elemental calcium) chewable tablet 200 mg of elemental calcium, 1 tablet, Oral, TID, Pola Corn, NP, 200 mg of elemental calcium at 09/23/15 0054 .  cinacalcet (SENSIPAR) tablet 120 mg, 120  mg, Oral, QHS, Ozella Rocks, MD, 120 mg at 09/23/15 0054 .  efavirenz (SUSTIVA) tablet 600 mg, 600 mg, Oral, QHS, Ozella Rocks, MD, 600 mg at 09/23/15 0155 .  guaiFENesin (MUCINEX) 12 hr tablet 600 mg, 600 mg, Oral, BID, Ozella Rocks, MD, 600 mg at 09/23/15 0054 .  ipratropium (ATROVENT) 0.06 % nasal spray 2 spray, 2 spray, Each Nare, QID PRN, Ozella Rocks, MD .  Melene Muller ON 09/24/2015] lamiVUDine (EPIVIR) tablet 150 mg, 150 mg, Oral, Once per day on Mon Thu, Costin M Gherghe, MD .  levETIRAcetam (KEPPRA) tablet 500 mg, 500 mg, Oral, BID, Ozella Rocks, MD, 500 mg at 09/23/15 0054 .  multivitamin (RENA-VIT) tablet 1 tablet, 1 tablet, Oral, QHS, Ozella Rocks, MD, 1 tablet at 09/23/15 0055 .  ondansetron (ZOFRAN) tablet 4 mg, 4 mg, Oral, Q6H PRN **OR** ondansetron (ZOFRAN) injection 4 mg, 4 mg, Intravenous, Q6H PRN, Ozella Rocks, MD .  oxyCODONE-acetaminophen (PERCOCET/ROXICET) 5-325 MG per tablet 2 tablet, 2 tablet, Oral, Q4H PRN, Ozella Rocks, MD, 2 tablet at 09/22/15 2154 .  pantoprazole (PROTONIX) 80 mg in sodium chloride 0.9 % 250 mL (0.32 mg/mL) infusion, 8 mg/hr, Intravenous, Continuous, Ozella Rocks, MD, Last Rate: 25 mL/hr at 09/23/15 1107, 8 mg/hr at 09/23/15 1107 .  [START ON 09/26/2015] pantoprazole (PROTONIX) injection 40 mg, 40 mg, Intravenous, Q12H,  Ozella Rocksavid J Merrell, MD .  promethazine [facility](PHENERGAN) tablet 25 mg, 25 mg, Oral, Q6H PRN, Ozella Rocksavid J Merrell, MD .  sodium chloride (OCEAN) 0.65 % nasal spray 1 spray, 1 spray, Each Nare, PRN, Ozella Rocksavid J Merrell, MD .  sodium chloride flush (NS) 0.9 % injection 3 mL, 3 mL, Intravenous, Q12H, Ozella Rocksavid J Merrell, MD, 0 mL at 09/22/15 2200 .  sodium chloride flush (NS) 0.9 % injection 3 mL, 3 mL, Intravenous, Q12H, Ozella Rocksavid J Merrell, MD, 3 mL at 09/23/15 0056 .  sodium chloride flush (NS) 0.9 % injection 3 mL, 3 mL, Intravenous, PRN, Ozella Rocksavid J Merrell, MD .  sucroferric oxyhydroxide [facility](VELPHORO) chewable tablet 1,000 mg, 1,000 mg, Oral, TID WC,  Pola Cornita Harbison Brown, NP .  Melene Muller[START ON 09/28/2015] tenofovir (VIREAD) tablet 300 mg, 300 mg, Oral, Q Mon, Ozella Rocksavid J Merrell, MD No Known Allergies   Objective:     BP 140/72 mmHg  Pulse 104  Temp(Src) 97.9 F (36.6 C) (Oral)  Resp 18  Ht 5\' 4"  (1.626 m)  Wt 77.2 kg (170 lb 3.1 oz)  BMI 29.20 kg/m2  SpO2 99%  He is in no distress  Nonicteric  Heart regular rhythm no murmurs  Lungs clear  Abdomen is soft, nontender, no obvious hepatosplenomegaly  Laboratory No components found for: D1    Assessment:     Hematemesis  Multiple medical problems as mentioned above      Plan:     We will schedule him for EGD later today. Continue PPI therapy. Lab Results  Component Value Date   HGB 11.5* 09/23/2015   HGB 11.3* 09/22/2015   HGB 11.4* 09/22/2015   HCT 35.2* 09/23/2015   HCT 33.7* 09/22/2015   HCT 33.8* 09/22/2015   ALKPHOS 241* 09/23/2015   ALKPHOS 219* 09/22/2015   ALKPHOS 257* 09/15/2015   AST 18 09/23/2015   AST 18 09/22/2015   AST 24 09/15/2015   ALT 15* 09/23/2015   ALT 15* 09/22/2015   ALT 14* 09/15/2015

## 2015-09-24 ENCOUNTER — Encounter (HOSPITAL_COMMUNITY): Payer: Self-pay | Admitting: Gastroenterology

## 2015-09-24 ENCOUNTER — Other Ambulatory Visit: Payer: Self-pay

## 2015-09-24 MED ORDER — LAMIVUDINE 150 MG PO TABS: 150.0000 mg | ORAL_TABLET | ORAL | Status: DC

## 2015-09-24 MED ORDER — EFAVIRENZ 600 MG PO TABS: 600.0000 mg | ORAL_TABLET | Freq: Every day | ORAL | Status: DC

## 2015-09-24 MED ORDER — TENOFOVIR DISOPROXIL FUMARATE 300 MG PO TABS: 300.0000 mg | ORAL_TABLET | ORAL | Status: DC

## 2015-09-24 NOTE — Discharge Summary (Signed)
Physician Against Medical Advice Discharge Summary  Robert Bradshaw ZOX:[id number] DOB: 01-27-1981 DOA: 09/22/2015  PCP: Jeanine Luz, FNP  Admit date: 09/22/2015 Discharge date: 09/24/2015   Patient left AGAINST MEDICAL ADVICE   Discharge Diagnoses:  Principal Problem:   Hematemesis Active Problems:   End stage renal disease (HCC)   Seizures (HCC)   GERD (gastroesophageal reflux disease)   HIV disease (HCC)   URI (upper respiratory infection)   Filed Weights   09/22/15 1805 09/22/15 2357 09/23/15 1405  Weight: 85.3 kg (188 lb 0.8 oz) 77.2 kg (170 lb 3.1 oz) 77.111 kg (170 lb)    History of present illness:  See H&P, Labs, Consult and Test reports for all details in brief, patient is a 35 y.o. male with medical history significant of HIV, ESRD on dialysis, HTN, migraines, parathyroid dysfunction, who was admitted on 5/2 after an episode of large volume hematemesis.   Hospital Course:  Patient was admitted to the hospital with large volume hematemesis and concern for upper GI bleed. Gas and neurology was consulted and have followed patient hospitalized, he underwent an EGD on 5/3 showed grade 1 esophageal varices as well as a nonbleeding gastric ulcer, and normal duodenum. Following EGD, at the end of the day, patient decided TO LEAVE AGAINST MEDICAL ADVICE, and left the floor before this M.D. Nolberto Hanlon discuss this further with the patient. Talked to RN, patient has been warned that this is not Medically advisable at this time, and can result in Medical complications like Death and Disability, patient understands and accepts the risks involved and assumes full responsibilty of this decision. Nephrology was consulted for his end-stage renal disease, will need outpatient follow-up for dialysis His HIV is stable, last CD4 count last month was 530 He had no seizures while hospitalized and he is on Keppra at home    Procedures:  EGD Impression - Grade I esophageal varices. - Non-bleeding  gastric ulcer with no stigmata of bleeding. - Normal examined duodenum. - No specimens collected.   Consultations:  Gastroenterology   Nephrology     Medication List    ASK your doctor about these medications        atorvastatin 20 MG tablet  Commonly known as:  LIPITOR  Take 20 mg by mouth at bedtime.     AURYXIA 1 GM 210 MG(Fe) Tabs  Generic drug:  Ferric Citrate  Take 630 mg by mouth 3 (three) times daily with meals.     benzonatate 200 MG capsule  Commonly known as:  TESSALON  Take 200 mg by mouth 3 (three) times daily as needed for cough.     busPIRone 10 MG tablet  Commonly known as:  BUSPAR  Take 1 tablet (10 mg total) by mouth 2 (two) times daily.     cinacalcet 60 MG tablet  Commonly known as:  SENSIPAR  Take 120 mg by mouth at bedtime.     efavirenz 600 MG tablet  Commonly known as:  SUSTIVA  Take 600 mg by mouth at bedtime.     folic acid-vitamin b complex-vitamin c-selenium-zinc 3 MG Tabs tablet  Take 1 tablet by mouth daily.     lamiVUDine 150 MG tablet  Commonly known as:  EPIVIR  Take 150 mg by mouth 2 (two) times a week.     levETIRAcetam 500 MG tablet  Commonly known as:  KEPPRA  Take 1 tablet (500 mg total) by mouth 2 (two) times daily.     multivitamins with iron Tabs tablet  Take 1 tablet by mouth daily.     ondansetron 4 MG disintegrating tablet  Commonly known as:  ZOFRAN-ODT  Take 4 mg by mouth every 8 (eight) hours as needed for nausea or vomiting.     oxyCODONE-acetaminophen 5-325 MG tablet  Commonly known as:  PERCOCET/ROXICET  Take 2 tablets by mouth every 4 (four) hours as needed for severe pain.     promethazine 25 MG tablet  Commonly known as:  PHENERGAN  Take 25 mg by mouth every 6 (six) hours as needed for nausea or vomiting.     ranitidine 150 MG tablet  Commonly known as:  ZANTAC  Take 1 tablet (150 mg total) by mouth daily.     RENA-VITE PO  Take 1 tablet by mouth daily.     SUMAtriptan 100 MG tablet    Commonly known as:  IMITREX  Take 0.5-1 tablets by mouth at the onset of a headache. May repeat in 2 hours if headache persists or recurs.     tenofovir 300 MG tablet  Commonly known as:  VIREAD  Take 300 mg by mouth once a week.         The results of significant diagnostics from this hospitalization (including imaging, microbiology, ancillary and laboratory) are listed below for reference.    Significant Diagnostic Studies: Dg Chest 2 View  09/22/2015  CLINICAL DATA:  Cough, vomiting bright red blood EXAM: CHEST  2 VIEW COMPARISON:  None. FINDINGS: The heart size and mediastinal contours are within normal limits. Both lungs are clear. The visualized skeletal structures are unremarkable. IMPRESSION: No active cardiopulmonary disease. Electronically Signed   By: Elige KoHetal  Patel   On: 09/22/2015 12:57   Ct Head Wo Contrast  09/15/2015  CLINICAL DATA:  Altered mental status. Dizziness and nausea. Confusion. EXAM: CT HEAD WITHOUT CONTRAST TECHNIQUE: Contiguous axial images were obtained from the base of the skull through the vertex without intravenous contrast. COMPARISON:  None. FINDINGS: No intracranial hemorrhage, mass effect, or midline shift. No hydrocephalus. The basilar cisterns are patent. No evidence of territorial infarct. No intracranial fluid collection. Atherosclerosis of skullbase vasculature, advanced for age. Calvarium is intact. Included paranasal sinuses and mastoid air cells are well aerated. IMPRESSION: 1.  No acute intracranial abnormality. 2. Skullbase atherosclerosis, advanced for age. Electronically Signed   By: Rubye OaksMelanie  Ehinger M.D.   On: 09/15/2015 20:45    Microbiology: Recent Results (from the past 240 hour(s))  MRSA PCR Screening     Status: None   Collection Time: 09/23/15  2:30 AM  Result Value Ref Range Status   MRSA by PCR NEGATIVE NEGATIVE Final    Comment:        The GeneXpert MRSA Assay (FDA approved for NASAL specimens only), is one component of  a comprehensive MRSA colonization surveillance program. It is not intended to diagnose MRSA infection nor to guide or monitor treatment for MRSA infections.      Labs: Basic Metabolic Panel:  Recent Labs Lab 09/22/15 1139 09/22/15 1832 09/23/15 0502  NA 141  --  138  K 4.1  --  3.5  CL 98*  --  97*  CO2 21*  --  24  GLUCOSE 86  --  87  BUN 77*  --  39*  CREATININE 24.23*  --  16.16*  CALCIUM 7.4*  --  8.1*  MG  --  2.6*  --   PHOS  --  9.4*  --    Liver Function Tests:  Recent Labs Lab  09/22/15 1139 09/23/15 0502  AST 18 18  ALT 15* 15*  ALKPHOS 219* 241*  BILITOT 0.6 0.5  PROT 7.5 7.5  ALBUMIN 3.4* 3.4*   CBC:  Recent Labs Lab 09/22/15 1139 09/22/15 1832 09/23/15 0502 09/23/15 1800  WBC 6.3 6.7 5.3 4.3  NEUTROABS 4.2  --   --   --   HGB 11.4* 11.3* 11.5* 11.5*  HCT 33.8* 33.7* 35.2* 34.7*  MCV 91.1 88.9 90.0 89.9  PLT 147* 148* 150 132*   Signed:  Aquinnah Devin  [facility] 09/24/2015, 2:56 PM

## 2015-09-24 NOTE — Telephone Encounter (Signed)
Patient's wife called to request refill of HIV medications . Patient was admitted to hospital one day prior to appointment with Dr Daiva EvesVan Dam and discharged without refills.   Medications sent to pharmacy.   Laurell Josephsammy K King, RN

## 2015-09-29 ENCOUNTER — Emergency Department (HOSPITAL_COMMUNITY)
Admission: EM | Admit: 2015-09-29 | Discharge: 2015-09-29 | Disposition: A | Discharge status: Home / Self Care | Payer: Medicare Other | Source: Home / Self Care | Attending: Emergency Medicine | Admitting: Emergency Medicine

## 2015-09-29 ENCOUNTER — Encounter (HOSPITAL_COMMUNITY): Payer: Self-pay

## 2015-09-29 DIAGNOSIS — K254 Chronic or unspecified gastric ulcer with hemorrhage: Secondary | ICD-10-CM | POA: Insufficient documentation

## 2015-09-29 DIAGNOSIS — Z79899 Other long term (current) drug therapy: Secondary | ICD-10-CM

## 2015-09-29 DIAGNOSIS — G43909 Migraine, unspecified, not intractable, without status migrainosus: Secondary | ICD-10-CM

## 2015-09-29 DIAGNOSIS — I120 Hypertensive chronic kidney disease with stage 5 chronic kidney disease or end stage renal disease: Secondary | ICD-10-CM

## 2015-09-29 DIAGNOSIS — K920 Hematemesis: Secondary | ICD-10-CM

## 2015-09-29 DIAGNOSIS — B20 Human immunodeficiency virus [HIV] disease: Secondary | ICD-10-CM

## 2015-09-29 DIAGNOSIS — Z8639 Personal history of other endocrine, nutritional and metabolic disease: Secondary | ICD-10-CM | POA: Insufficient documentation

## 2015-09-29 DIAGNOSIS — N186 End stage renal disease: Secondary | ICD-10-CM

## 2015-09-29 DIAGNOSIS — K259 Gastric ulcer, unspecified as acute or chronic, without hemorrhage or perforation: Secondary | ICD-10-CM

## 2015-09-29 DIAGNOSIS — K219 Gastro-esophageal reflux disease without esophagitis: Secondary | ICD-10-CM | POA: Insufficient documentation

## 2015-09-29 DIAGNOSIS — F17210 Nicotine dependence, cigarettes, uncomplicated: Secondary | ICD-10-CM

## 2015-09-29 DIAGNOSIS — Z862 Personal history of diseases of the blood and blood-forming organs and certain disorders involving the immune mechanism: Secondary | ICD-10-CM

## 2015-09-29 DIAGNOSIS — Z992 Dependence on renal dialysis: Secondary | ICD-10-CM | POA: Insufficient documentation

## 2015-09-29 LAB — COMPREHENSIVE METABOLIC PANEL
ALBUMIN: 3.8 g/dL (ref 3.5–5.0)
ALT: 14 U/L — AB (ref 17–63)
AST: 23 U/L (ref 15–41)
Alkaline Phosphatase: 235 U/L — ABNORMAL HIGH (ref 38–126)
Anion gap: 22 — ABNORMAL HIGH (ref 5–15)
BUN: 65 mg/dL — AB (ref 6–20)
CHLORIDE: 93 mmol/L — AB (ref 101–111)
CO2: 25 mmol/L (ref 22–32)
Calcium: 8.6 mg/dL — ABNORMAL LOW (ref 8.9–10.3)
Creatinine, Ser: 18.96 mg/dL — ABNORMAL HIGH (ref 0.61–1.24)
GFR calc Af Amer: 3 mL/min — ABNORMAL LOW (ref >60)
GFR, EST NON AFRICAN AMERICAN: 3 mL/min — AB (ref >60)
GLUCOSE: 89 mg/dL (ref 65–99)
POTASSIUM: 4.2 mmol/L (ref 3.5–5.1)
Sodium: 140 mmol/L (ref 135–145)
Total Bilirubin: 0.8 mg/dL (ref 0.3–1.2)
Total Protein: 8.3 g/dL — ABNORMAL HIGH (ref 6.5–8.1)

## 2015-09-29 LAB — CBC WITH DIFFERENTIAL/PLATELET
BASOS ABS: 0 10*3/uL (ref 0.0–0.1)
BASOS PCT: 0 %
Eosinophils Absolute: 0.2 10*3/uL (ref 0.0–0.7)
Eosinophils Relative: 3 %
HCT: 35.4 % — ABNORMAL LOW (ref 39.0–52.0)
Hemoglobin: 11.7 g/dL — ABNORMAL LOW (ref 13.0–17.0)
LYMPHS PCT: 24 %
Lymphs Abs: 1.6 10*3/uL (ref 0.7–4.0)
MCH: 29.6 pg (ref 26.0–34.0)
MCHC: 33.1 g/dL (ref 30.0–36.0)
MCV: 89.6 fL (ref 78.0–100.0)
MONO ABS: 0.3 10*3/uL (ref 0.1–1.0)
Monocytes Relative: 4 %
Neutro Abs: 4.5 10*3/uL (ref 1.7–7.7)
Neutrophils Relative %: 69 %
PLATELETS: 177 10*3/uL (ref 150–400)
RBC: 3.95 MIL/uL — AB (ref 4.22–5.81)
RDW: 16.4 % — AB (ref 11.5–15.5)
WBC: 6.6 10*3/uL (ref 4.0–10.5)

## 2015-09-29 MED ORDER — SUCRALFATE 1 G PO TABS: 1.0000 g | ORAL_TABLET | Freq: Two times a day (BID) | ORAL | Status: DC

## 2015-09-29 MED ORDER — PANTOPRAZOLE SODIUM 20 MG PO TBEC: 40.0000 mg | DELAYED_RELEASE_TABLET | Freq: Every day | ORAL | Status: DC

## 2015-09-29 NOTE — ED Notes (Signed)
Per patient, blood pressure must be taken in legs for he is "a dialysis patient and gets it done in both arms"

## 2015-09-29 NOTE — ED Notes (Signed)
MD at bedside. 

## 2015-09-29 NOTE — ED Provider Notes (Signed)
CSN: [id number]     Arrival date & time 09/29/15  1215 History   First MD Initiated Contact with Patient 09/29/15 1259     Chief Complaint  Patient presents with  . Hematemesis     (Consider location/radiation/quality/duration/timing/severity/associated sxs/prior Treatment) HPI Comments: Robert Bradshaw is a 35 y.o. male with HIV, GERD, ESRD receiving dialysis T, TH, Sat presents to ED for hematemesis. Patient was at dialysis this morning when he had an episode of hematemesis. Dialysis sent him to the ED. Patient endorses one episode of hematemesis - bright red in appearance. He was admitted last week for hematemesis and underwent an EGD showing grade 1 esophageal varices in the lower esophagus and a non-bleeding peptic ulcer. Patient left AMA without receiving d/c medications. He denies any additional episodes of hematemesis except for the one today. Patient denies fever, chills, night sweats. No abdominal pain, nausea, vomiting, diarrhea, or constipation. Endorses dark stools secondary to iron medication. No light headedness, dizziness, or LOC.   The history is provided by the patient, the spouse and medical records.    Past Medical History  Diagnosis Date  . Renal disorder   . Hypertension   . Immune deficiency disorder (HCC)   . HIV (human immunodeficiency virus infection) (HCC)   . GERD (gastroesophageal reflux disease)   . Migraines   . Parathyroid abnormality [facility])    Past Surgical History  Procedure Laterality Date  . Av fistula placement    . Esophagogastroduodenoscopy (egd) with propofol N/A 09/23/2015    Procedure: ESOPHAGOGASTRODUODENOSCOPY (EGD) WITH PROPOFOL;  Surgeon: Robert Shiver, MD;  Location: [facility] ENDOSCOPY;  Service: Endoscopy;  Laterality: N/A;   Family History  Problem Relation Age of Onset  . Hypertension Mother   . Diabetes Mother   . Hypertension Maternal Grandfather    Social History  Substance Use Topics  . Smoking status: Current Every Day Smoker -- 0.50  packs/day for 20 years    Types: Cigarettes  . Smokeless tobacco: None  . Alcohol Use: No    Review of Systems  Gastrointestinal: Positive for vomiting ( hematemesis x 1).  Allergic/Immunologic: Positive for immunocompromised state.  All other systems reviewed and are negative.     Allergies  Review of patient's allergies indicates no known allergies.  Home Medications   Prior to Admission medications   Medication Sig Start Date End Date Taking? Authorizing Provider  atorvastatin (LIPITOR) 20 MG tablet Take 20 mg by mouth at bedtime.   Yes Historical Provider, MD  B Complex-C-Folic Acid (RENA-VITE PO) Take 1 tablet by mouth daily.   Yes Historical Provider, MD  benzonatate (TESSALON) 200 MG capsule Take 200 mg by mouth 3 (three) times daily as needed for cough.   Yes Historical Provider, MD  busPIRone (BUSPAR) 10 MG tablet Take 1 tablet (10 mg total) by mouth 2 (two) times daily. 09/16/15  Yes Robert Speak, FNP  cinacalcet (SENSIPAR) 60 MG tablet Take 120 mg by mouth at bedtime.   Yes Historical Provider, MD  efavirenz (SUSTIVA) 600 MG tablet Take 1 tablet (600 mg total) by mouth at bedtime. 09/24/15  Yes Randall Hiss, MD  Ferric Citrate (AURYXIA) 1 GM 210 MG(Fe) TABS Take 630 mg by mouth 3 (three) times daily with meals.   Yes Historical Provider, MD  folic acid-vitamin b complex-vitamin c-selenium-zinc (DIALYVITE) 3 MG TABS tablet Take 1 tablet by mouth daily.   Yes Historical Provider, MD  lamiVUDine (EPIVIR) 150 MG tablet Take 1 tablet (150 mg total)  by mouth 2 (two) times a week. 09/24/15  Yes Randall Hissornelius N Van Dam, MD  levETIRAcetam (KEPPRA) 500 MG tablet Take 1 tablet (500 mg total) by mouth 2 (two) times daily. 09/16/15  Yes Robert SpeakGregory D Calone, FNP  Multiple Vitamins-Iron (MULTIVITAMINS WITH IRON) TABS tablet Take 1 tablet by mouth daily.   Yes Historical Provider, MD  ondansetron (ZOFRAN-ODT) 4 MG disintegrating tablet Take 4 mg by mouth every 8 (eight) hours as needed for  nausea or vomiting.   Yes Historical Provider, MD  oxyCODONE-acetaminophen (PERCOCET/ROXICET) 5-325 MG tablet Take 2 tablets by mouth every 4 (four) hours as needed for severe pain.   Yes Historical Provider, MD  promethazine (PHENERGAN) 25 MG tablet Take 25 mg by mouth every 6 (six) hours as needed for nausea or vomiting.   Yes Historical Provider, MD  ranitidine (ZANTAC) 150 MG tablet Take 1 tablet (150 mg total) by mouth daily. Patient taking differently: Take 150 mg by mouth 2 (two) times daily.  09/16/15  Yes Robert SpeakGregory D Calone, FNP  tenofovir (VIREAD) 300 MG tablet Take 1 tablet (300 mg total) by mouth once a week. 09/24/15  Yes Randall Hissornelius N Van Dam, MD  pantoprazole (PROTONIX) 20 MG tablet Take 2 tablets (40 mg total) by mouth daily. 09/29/15   [name], PA-C  sucralfate (CARAFATE) 1 g tablet Take 1 tablet (1 g total) by mouth 2 (two) times daily. 09/29/15   Lona KettleAshley Laurel [name], PA-C  SUMAtriptan (IMITREX) 100 MG tablet Take 0.5-1 tablets by mouth at the onset of a headache. May repeat in 2 hours if headache persists or recurs. Patient not taking: Reported on 09/22/2015 09/16/15   Robert SpeakGregory D Calone, FNP   BP 155/64 mmHg  Pulse 97  Temp(Src) 97.6 F (36.4 C) (Oral)  Resp 12  Ht 5\' 4"  (1.626 m)  Wt 81.194 kg  BMI 30.71 kg/m2  SpO2 94% Physical Exam  Constitutional: He appears well-developed and well-nourished. No distress.  HENT:  Head: Normocephalic and atraumatic.  Mouth/Throat: Oropharynx is clear and moist. No oropharyngeal exudate.  Eyes: Conjunctivae and EOM are normal. Pupils are equal, round, and reactive to light. Right eye exhibits no discharge. Left eye exhibits no discharge. No scleral icterus.  Neck: Normal range of motion. Neck supple.  Cardiovascular: Normal rate, regular rhythm, normal heart sounds and intact distal pulses.   No murmur heard. Palpable thrill in left vascular access.   Pulmonary/Chest: Effort normal and breath sounds normal. No respiratory distress.   Abdominal: Soft. Bowel sounds are normal. There is no tenderness. There is no rebound and no guarding.  Musculoskeletal: Normal range of motion.  Lymphadenopathy:    He has no cervical adenopathy.  Neurological: He is alert. Coordination normal.  Skin: Skin is warm and dry. He is not diaphoretic.  Psychiatric: He has a normal mood and affect.    ED Course  Procedures (including critical care time) Labs Review Labs Reviewed  CBC WITH DIFFERENTIAL/PLATELET - Abnormal; Notable for the following:    RBC 3.95 (*)    Hemoglobin 11.7 (*)    HCT 35.4 (*)    RDW 16.4 (*)    All other components within normal limits  COMPREHENSIVE METABOLIC PANEL - Abnormal; Notable for the following:    Chloride 93 (*)    BUN 65 (*)    Creatinine, Ser 18.96 (*)    Calcium 8.6 (*)    Total Protein 8.3 (*)    ALT 14 (*)    Alkaline Phosphatase 235 (*)  GFR calc non Af Amer 3 (*)    GFR calc Af Amer 3 (*)    Anion gap 22 (*)    All other components within normal limits    Imaging Review No results found. I have personally reviewed and evaluated these images and lab results as part of my medical decision-making.   EKG Interpretation None      MDM   Final diagnoses:  Hematemesis without nausea  Gastric ulcer, unspecified chronicity   Euel Aversa is a 35 y.o. male with h/o ESRD and HIV reports to ED for hematemesis. One episode of hematemesis today at dialysis. He was admitted on 09/22/15 for hematemesis; underwent EGD on 09/22/15 and found to have grade 1 esophageal varices and a non-bleeding peptic ulcer. Patient signed himself out AMA and has not been on medications for ulcer. Patient is afebrile, non-toxic, with mildly elevated blood pressure. Physical exam is unremarkable. CBC shows stable hemoglobin compared to previous lab values. Alkaline phosphatase is elevated; however, review of previous records show chronically elevated. ALT is low; review of records indicate chronically low. Creatinine  chronically elevated secondary to ESRD - he did not complete dialysis today. Potassium within normal limits. Chloride and calcium are low, review shows chronically low - likely secondary to ESRD. Anion gap is 22, bicarb normal - likely secondary to low chloride via ESRD. No shortness of breath, tachypnea, or lower extremity swelling.   Patient has had no repeat episodes of hematemesis in the ED. No shortness of breath, dizziness, lightheadedness, or loss of consciousness. Suspect episode is secondary to peptic ulcer. Dr. Jodi Mourning consulted GI. GI recommended medication management and follow up with outpatient GI in next week. Prescribed carafate and protonix. Referral to gastroenterology. Discussed return precautions.         Lona Kettle, PA-C 09/29/15 1731  Blane Ohara, MD 09/30/15 [phone]

## 2015-09-29 NOTE — Discharge Instructions (Signed)
Read the information below.  Use the prescribed medication as directed.  Please discuss all new medications with your pharmacist.  Be sure to call and follow up with Gastroenterology in the next week - contact information is provided. You may return to the Emergency Department at any time for worsening condition or any new symptoms that concern you. Return to the ED if symptoms worsen, continue to vomit blood, or have shortness of breath, chest pain, develop dizziness, lightheadedness, or loss of consciousness.   Peptic Ulcer A peptic ulcer is a painful sore in the lining of your esophagus, stomach, or in the first part of your small intestine. The main causes of an ulcer can be:  An infection.  Using certain pain medicines too often or too much.  Smoking. HOME CARE  Avoid smoking, alcohol, and caffeine.  Avoid foods that bother you.  Only take medicine as told by your doctor. Do not take any medicines your doctor has not approved.  Keep all doctor visits as told. GET HELP IF:  You do not get better in 7 days after starting treatment.  You keep having an upset stomach (indigestion) or heartburn. GET HELP RIGHT AWAY IF:  You have sudden, sharp, or lasting belly (abdominal) pain.  You have bloody, black, or tarry poop (stool).  You throw up (vomit) blood or your throw up looks like coffee grounds.  You get light-headed, weak, or feel like you will pass out (faint).  You get sweaty or feel sticky and cold to the touch (clammy). MAKE SURE YOU:   Understand these instructions.  Will watch your condition.  Will get help right away if you are not doing well or get worse.   This information is not intended to replace advice given to you by your health care provider. Make sure you discuss any questions you have with your health care provider.   Document Released: 08/03/2009 Document Revised: 05/30/2014 Document Reviewed: 12/07/2011 Elsevier Interactive Patient Education [id number]  [employer]

## 2015-09-29 NOTE — ED Notes (Signed)
PA at bedside.

## 2015-09-29 NOTE — ED Notes (Signed)
Pt presents with onset of dark red emesis that began today.  Pt was seen here last week for same, was admitted for bleeding ulcers but signed out AMA.  Pt also reports no dialysis today due to hematemesis.

## 2015-09-29 NOTE — ED Notes (Signed)
Pt reports he coughed up blood, denies vomiting today.

## 2015-09-30 ENCOUNTER — Observation Stay (HOSPITAL_COMMUNITY): Payer: Medicare Other

## 2015-09-30 ENCOUNTER — Inpatient Hospital Stay (HOSPITAL_COMMUNITY)
Admission: EM | Admit: 2015-09-30 | Discharge: 2015-10-07 | DRG: 356 | Discharge status: Against Medical Advice | Payer: Medicare Other | Attending: Internal Medicine | Admitting: Internal Medicine

## 2015-09-30 ENCOUNTER — Encounter (HOSPITAL_COMMUNITY): Payer: Self-pay | Admitting: Family Medicine

## 2015-09-30 DIAGNOSIS — Z419 Encounter for procedure for purposes other than remedying health state, unspecified: Secondary | ICD-10-CM

## 2015-09-30 DIAGNOSIS — K920 Hematemesis: Secondary | ICD-10-CM | POA: Diagnosis not present

## 2015-09-30 DIAGNOSIS — R569 Unspecified convulsions: Secondary | ICD-10-CM

## 2015-09-30 DIAGNOSIS — N186 End stage renal disease: Secondary | ICD-10-CM | POA: Diagnosis not present

## 2015-09-30 DIAGNOSIS — Z452 Encounter for adjustment and management of vascular access device: Secondary | ICD-10-CM

## 2015-09-30 DIAGNOSIS — B20 Human immunodeficiency virus [HIV] disease: Secondary | ICD-10-CM | POA: Diagnosis present

## 2015-09-30 DIAGNOSIS — R042 Hemoptysis: Secondary | ICD-10-CM

## 2015-09-30 LAB — COMPREHENSIVE METABOLIC PANEL
ALT: 14 U/L — AB (ref 17–63)
AST: 21 U/L (ref 15–41)
Albumin: 3.6 g/dL (ref 3.5–5.0)
Alkaline Phosphatase: 229 U/L — ABNORMAL HIGH (ref 38–126)
Anion gap: 22 — ABNORMAL HIGH (ref 5–15)
BILIRUBIN TOTAL: 0.8 mg/dL (ref 0.3–1.2)
BUN: 74 mg/dL — AB (ref 6–20)
CO2: 23 mmol/L (ref 22–32)
CREATININE: 21.76 mg/dL — AB (ref 0.61–1.24)
Calcium: 9.1 mg/dL (ref 8.9–10.3)
Chloride: 93 mmol/L — ABNORMAL LOW (ref 101–111)
GFR, EST AFRICAN AMERICAN: 3 mL/min — AB (ref >60)
GFR, EST NON AFRICAN AMERICAN: 2 mL/min — AB (ref >60)
Glucose, Bld: 74 mg/dL (ref 65–99)
Potassium: 3.9 mmol/L (ref 3.5–5.1)
Sodium: 138 mmol/L (ref 135–145)
TOTAL PROTEIN: 7.7 g/dL (ref 6.5–8.1)

## 2015-09-30 LAB — CBC
HEMATOCRIT: 32.4 % — AB (ref 39.0–52.0)
Hemoglobin: 10.6 g/dL — ABNORMAL LOW (ref 13.0–17.0)
MCH: 28.9 pg (ref 26.0–34.0)
MCHC: 32.7 g/dL (ref 30.0–36.0)
MCV: 88.3 fL (ref 78.0–100.0)
Platelets: 159 10*3/uL (ref 150–400)
RBC: 3.67 MIL/uL — ABNORMAL LOW (ref 4.22–5.81)
RDW: 16.3 % — AB (ref 11.5–15.5)
WBC: 6.5 10*3/uL (ref 4.0–10.5)

## 2015-09-30 MED ORDER — ONDANSETRON HCL 4 MG PO TABS
4.0000 mg | ORAL_TABLET | Freq: Four times a day (QID) | ORAL | Status: DC | PRN
  Administered 2015-10-06: 4 mg via ORAL
  Filled 2015-09-30: qty 1

## 2015-09-30 MED ORDER — BUSPIRONE HCL 10 MG PO TABS
10.0000 mg | ORAL_TABLET | Freq: Two times a day (BID) | ORAL | Status: DC
  Administered 2015-09-30 – 2015-10-07 (×14): 10 mg via ORAL
  Filled 2015-09-30 (×17): qty 1

## 2015-09-30 MED ORDER — PANTOPRAZOLE SODIUM 40 MG IV SOLR
40.0000 mg | Freq: Two times a day (BID) | INTRAVENOUS | Status: DC
  Administered 2015-10-01 – 2015-10-07 (×13): 40 mg via INTRAVENOUS
  Filled 2015-09-30 (×13): qty 40

## 2015-09-30 MED ORDER — EFAVIRENZ 600 MG PO TABS
600.0000 mg | ORAL_TABLET | Freq: Every day | ORAL | Status: DC
  Administered 2015-09-30 – 2015-10-06 (×7): 600 mg via ORAL
  Filled 2015-09-30 (×9): qty 1

## 2015-09-30 MED ORDER — ONDANSETRON HCL 4 MG/2ML IJ SOLN
4.0000 mg | Freq: Once | INTRAMUSCULAR | Status: AC
  Administered 2015-09-30: 4 mg via INTRAVENOUS
  Filled 2015-09-30: qty 2

## 2015-09-30 MED ORDER — TAB-A-VITE/IRON PO TABS
1.0000 | ORAL_TABLET | Freq: Every day | ORAL | Status: DC
  Administered 2015-10-01 – 2015-10-07 (×6): 1 via ORAL
  Filled 2015-09-30 (×7): qty 1

## 2015-09-30 MED ORDER — LAMIVUDINE 150 MG PO TABS
150.0000 mg | ORAL_TABLET | ORAL | Status: DC
  Administered 2015-10-01: 150 mg via ORAL
  Filled 2015-09-30: qty 1

## 2015-09-30 MED ORDER — TENOFOVIR DISOPROXIL FUMARATE 300 MG PO TABS
300.0000 mg | ORAL_TABLET | ORAL | Status: DC
  Filled 2015-09-30: qty 1

## 2015-09-30 MED ORDER — ONDANSETRON HCL 4 MG/2ML IJ SOLN
4.0000 mg | Freq: Four times a day (QID) | INTRAMUSCULAR | Status: DC | PRN
  Administered 2015-10-03 – 2015-10-06 (×5): 4 mg via INTRAVENOUS
  Filled 2015-09-30 (×6): qty 2

## 2015-09-30 MED ORDER — OCTREOTIDE LOAD VIA INFUSION
50.0000 ug | Freq: Once | INTRAVENOUS | Status: AC
  Administered 2015-09-30: 50 ug via INTRAVENOUS
  Filled 2015-09-30: qty 25

## 2015-09-30 MED ORDER — OCTREOTIDE ACETATE 50 MCG/ML IJ SOLN
50.0000 ug | Freq: Once | INTRAMUSCULAR | Status: DC
  Filled 2015-09-30: qty 1

## 2015-09-30 MED ORDER — CINACALCET HCL 30 MG PO TABS: 120.0000 mg | ORAL_TABLET | Freq: Every day | ORAL | Status: DC

## 2015-09-30 MED ORDER — FAMOTIDINE 20 MG PO TABS
20.0000 mg | ORAL_TABLET | Freq: Every day | ORAL | Status: DC
  Filled 2015-09-30: qty 1

## 2015-09-30 MED ORDER — CEFTRIAXONE SODIUM 1 G IJ SOLR
1.0000 g | INTRAMUSCULAR | Status: DC
  Administered 2015-09-30 – 2015-10-03 (×4): 1 g via INTRAVENOUS
  Filled 2015-09-30 (×5): qty 10

## 2015-09-30 MED ORDER — SODIUM CHLORIDE 0.9 % IV SOLN
50.0000 ug/h | INTRAVENOUS | Status: DC
  Administered 2015-09-30 – 2015-10-03 (×6): 50 ug/h via INTRAVENOUS
  Filled 2015-09-30 (×14): qty 1

## 2015-09-30 MED ORDER — ACETAMINOPHEN 650 MG RE SUPP: 650.0000 mg | Freq: Four times a day (QID) | RECTAL | Status: DC | PRN

## 2015-09-30 MED ORDER — ACETAMINOPHEN 325 MG PO TABS
650.0000 mg | ORAL_TABLET | Freq: Four times a day (QID) | ORAL | Status: DC | PRN
Start: 2015-09-30 — End: 2015-10-07
  Administered 2015-09-30 – 2015-10-06 (×14): 650 mg via ORAL
  Filled 2015-09-30 (×13): qty 2

## 2015-09-30 MED ORDER — LEVETIRACETAM 500 MG PO TABS
500.0000 mg | ORAL_TABLET | Freq: Two times a day (BID) | ORAL | Status: DC
  Administered 2015-09-30 – 2015-10-07 (×14): 500 mg via ORAL
  Filled 2015-09-30 (×15): qty 1

## 2015-09-30 MED ORDER — PANTOPRAZOLE SODIUM 40 MG IV SOLR
40.0000 mg | Freq: Once | INTRAVENOUS | Status: AC
  Administered 2015-09-30: 40 mg via INTRAVENOUS
  Filled 2015-09-30: qty 40

## 2015-09-30 MED ORDER — SODIUM CHLORIDE 0.9 % IV BOLUS (SEPSIS): 1000.0000 mL | Freq: Once | INTRAVENOUS | Status: DC

## 2015-09-30 MED ORDER — CINACALCET HCL 30 MG PO TABS
120.0000 mg | ORAL_TABLET | Freq: Every day | ORAL | Status: DC
  Administered 2015-10-01 – 2015-10-06 (×6): 120 mg via ORAL
  Filled 2015-09-30 (×7): qty 4

## 2015-09-30 NOTE — ED Provider Notes (Signed)
CSN: [id number]     Arrival date & time 09/30/15  1241 History   First MD Initiated Contact with Patient 09/30/15 1634     Chief Complaint  Patient presents with  . Hematemesis   HPI   Robert Bradshaw is a 35 y.o. male PMH significant for HIV, GERD, HTN, ESRD (HD T, TH, Sat) presenting with a 2 day history of hematemesis (BRB, approx. 3 episodes). He was seen yesterday in the ED and left AMA for the same complaint. He endorses dizziness. He was admitted last week for hematemesis and underwent an EGD showing grade 1 esophageal varices in the lower esophagus and a non-bleeding peptic ulcer. He states he has an appointment with gastroenterology on May 31. He denies fevers, chills, chest pain, shortness of breath, abdominal pain, diarrhea, hematochezia, headaches, shortness of breath, weakness. He endorses dark stools but states this is chronic for him because he is on iron supplementation.  Past Medical History  Diagnosis Date  . Renal disorder   . Hypertension   . Immune deficiency disorder (HCC)   . HIV (human immunodeficiency virus infection) (HCC)   . GERD (gastroesophageal reflux disease)   . Migraines   . Parathyroid abnormality [facility](HCC)    Past Surgical History  Procedure Laterality Date  . Av fistula placement    . Esophagogastroduodenoscopy (egd) with propofol N/A 09/23/2015    Procedure: ESOPHAGOGASTRODUODENOSCOPY (EGD) WITH PROPOFOL;  Surgeon: Graylin ShiverSalem F Ganem, MD;  [facility];  Service: Endoscopy;  Laterality: N/A;   Family History  Problem Relation Age of Onset  . Hypertension Mother   . Diabetes Mother   . Hypertension Maternal Grandfather    Social History  Substance Use Topics  . Smoking status: Current Every Day Smoker -- 0.50 packs/day for 20 years    Types: Cigarettes  . Smokeless tobacco: None  . Alcohol Use: No    Review of Systems  Ten systems are reviewed and are negative for acute change except as noted in the HPI  Allergies  Review of patient's  allergies indicates no known allergies.  Home Medications   Prior to Admission medications   Medication Sig Start Date End Date Taking? Authorizing Provider  atorvastatin (LIPITOR) 20 MG tablet Take 20 mg by mouth at bedtime.   Yes Historical Provider, MD  benzonatate (TESSALON) 200 MG capsule Take 200 mg by mouth 3 (three) times daily as needed for cough.   Yes Historical Provider, MD  busPIRone (BUSPAR) 10 MG tablet Take 1 tablet (10 mg total) by mouth 2 (two) times daily. 09/16/15  Yes Veryl SpeakGregory D Calone, FNP  cinacalcet (SENSIPAR) 60 MG tablet Take 120 mg by mouth at bedtime.   Yes Historical Provider, MD  efavirenz (SUSTIVA) 600 MG tablet Take 1 tablet (600 mg total) by mouth at bedtime. 09/24/15  Yes Randall Hissornelius N Van Dam, MD  Ferric Citrate (AURYXIA) 1 GM 210 MG(Fe) TABS Take 630 mg by mouth 3 (three) times daily with meals.   Yes Historical Provider, MD  folic acid-vitamin b complex-vitamin c-selenium-zinc (DIALYVITE) 3 MG TABS tablet Take 1 tablet by mouth daily.   Yes Historical Provider, MD  lamiVUDine (EPIVIR) 150 MG tablet Take 1 tablet (150 mg total) by mouth 2 (two) times a week. 09/24/15  Yes Randall Hissornelius N Van Dam, MD  levETIRAcetam (KEPPRA) 500 MG tablet Take 1 tablet (500 mg total) by mouth 2 (two) times daily. 09/16/15  Yes Veryl SpeakGregory D Calone, FNP  Multiple Vitamins-Iron (MULTIVITAMINS WITH IRON) TABS tablet Take 1 tablet by  mouth daily.   Yes Historical Provider, MD  ondansetron (ZOFRAN-ODT) 4 MG disintegrating tablet Take 4 mg by mouth every 8 (eight) hours as needed for nausea or vomiting.   Yes Historical Provider, MD  promethazine (PHENERGAN) 25 MG tablet Take 25 mg by mouth every 6 (six) hours as needed for nausea or vomiting.   Yes Historical Provider, MD  ranitidine (ZANTAC) 150 MG tablet Take 1 tablet (150 mg total) by mouth daily. Patient taking differently: Take 150 mg by mouth 2 (two) times daily.  09/16/15  Yes Veryl Speak, FNP  sucralfate (CARAFATE) 1 g tablet Take 1 tablet  (1 g total) by mouth 2 (two) times daily. 09/29/15  Yes Lona Kettle, PA-C  SUMAtriptan (IMITREX) 100 MG tablet Take 0.5-1 tablets by mouth at the onset of a headache. May repeat in 2 hours if headache persists or recurs. 09/16/15  Yes Veryl Speak, FNP  tenofovir (VIREAD) 300 MG tablet Take 1 tablet (300 mg total) by mouth once a week. 09/24/15  Yes Randall Hiss, MD  pantoprazole (PROTONIX) 20 MG tablet Take 2 tablets (40 mg total) by mouth daily. 09/29/15   Lona Kettle, PA-C   BP 180/80 mmHg  Pulse 83  Temp(Src) 97.8 F (36.6 C) (Oral)  Resp 19  SpO2 96% Physical Exam  Constitutional: No distress.  Chronically ill appearing  HENT:  Head: Normocephalic and atraumatic.  Eyes: Conjunctivae are normal. Right eye exhibits no discharge. Left eye exhibits no discharge. No scleral icterus.  Neck: No tracheal deviation present.  Cardiovascular: Normal rate, regular rhythm, normal heart sounds and intact distal pulses.  Exam reveals no gallop and no friction rub.   No murmur heard. Palpable thrill left UE vascular access  Pulmonary/Chest: Effort normal and breath sounds normal. No respiratory distress. He has no wheezes. He has no rales. He exhibits no tenderness.  Abdominal: Soft. Bowel sounds are normal. He exhibits no distension and no mass. There is no tenderness. There is no rebound and no guarding.  Musculoskeletal: He exhibits no edema.  Lymphadenopathy:    He has no cervical adenopathy.  Neurological: He is alert. Coordination normal.  Skin: Skin is warm and dry. No rash noted. He is not diaphoretic. No erythema.  Psychiatric: He has a normal mood and affect. His behavior is normal.  Nursing note and vitals reviewed.  ED Course  Procedures  Labs Review Labs Reviewed  CBC - Abnormal; Notable for the following:    RBC 3.67 (*)    Hemoglobin 10.6 (*)    HCT 32.4 (*)    RDW 16.3 (*)    All other components within normal limits  COMPREHENSIVE METABOLIC PANEL -  Abnormal; Notable for the following:    Chloride 93 (*)    BUN 74 (*)    Creatinine, Ser 21.76 (*)    ALT 14 (*)    Alkaline Phosphatase 229 (*)    GFR calc non Af Amer 2 (*)    GFR calc Af Amer 3 (*)    Anion gap 22 (*)    All other components within normal limits   MDM   Final diagnoses:  Hematemesis with nausea   Patient nontoxic appearing, VSS. Symptomatic GI bleed. Patient states he has taken 2 doses of protonix although previous ED visit states he left prior to receiving d/c ppwk.  Hemoglobin drop of 1.1 since yesterday. Labs otherwise at baseline. SCr of 21.76 but patient has not been to dialysis since Saturday.  6:39 PM Appreciate GI consult (Dr. Madilyn Fireman)- start patient on Octreotide. Protonix already administered. Most likely will see in AM. Will admit.   Spoke with Dr. Maryfrances Bunnell regarding admission- patient to go to obs med-surg.   Melton Krebs, PA-C 09/30/15 2033  Marily Memos, MD 09/30/15 2111

## 2015-09-30 NOTE — ED Notes (Signed)
Wife very unhappy  She insisted a stick on his rt dorsal hand  Just below the lt wrist  Old fistulas  In the lt arm

## 2015-09-30 NOTE — Progress Notes (Signed)
Utilization Review completed.  Anber Mckiver RN CM  

## 2015-09-30 NOTE — ED Notes (Signed)
pts wife very hostile.  Iv lt hand unsuccessful attempt to start  Iv vein blew iv team called

## 2015-09-30 NOTE — H&P (Signed)
History and Physical  Patient Name: Robert Bradshaw     XBJ:[id number]    DOB: 1980/08/02    DOA: 09/30/2015 PCP: Jeanine Luz, FNP  Outpatient specialists:  [facility] on [address] Neph unknown     ID: Wyonia Hough, not seen yet     [facility]: [facility]      Patient coming from: Home  Chief Complaint: Hematemesis  HPI: Robert Bradshaw is a 35 y.o. male with a past medical history significant for ESRD on HD TThS, HIV well controlled who presents with hematemesis again.  The patient recently moved from [place] in March, has been establishing care with ID and Neph here in [place], sees [facility] on [address].  One week ago, the patient had a coughing spell, with a "large volume" of BRB.  Came to the ER, was hemodynamically stable, had CBC 11 (from 13.7 in April), was admitted, had EGD with Dr. Evette Cristal in the hospital showing grade I varices (no known cirrhosis or alcohol use), and a non-bleeding ulcer.  Left AMA that day without medications.    Since then, he has had no problems until yesterday at HD, had another coughing fit, coughed up blood.  Came to the ER, Hgb stable from last discharge in mid-11s, hemodynamically stable.  Was given sucralfate and pantoprazole rx and discharged.  Last night, had repeated hematemesis (which he describes as "coughing up blood", specifically not vomiting), and lightheadedness with walking.  Today, had HA, lightheadedness, more hematemesis, so he came to the ER.  In the ED, he was not tachycardic or hyptonsive, but Hgb 1.1 g/dL drop from yesterday.  GI were called, Dr Madilyn Fireman from [place] [facility], who recommended admission and evaluation tomorrow after starting PPI bolus dosing and octreotide drip for possible variceal bleed versus bleeding ulcer, and TRH were asked to evaluate for admission.     Review of Systems:  All other systems negative except as just noted or noted in the history of present illness.    Past Medical History  Diagnosis Date  . Renal disorder   .  Hypertension   . Immune deficiency disorder (HCC)   . HIV (human immunodeficiency virus infection) (HCC)   . GERD (gastroesophageal reflux disease)   . Migraines   . Parathyroid abnormality [facility])     Past Surgical History  Procedure Laterality Date  . Av fistula placement    . Esophagogastroduodenoscopy (egd) with propofol N/A 09/23/2015    Procedure: ESOPHAGOGASTRODUODENOSCOPY (EGD) WITH PROPOFOL;  Surgeon: Graylin Shiver, MD;  Location: [facility] ENDOSCOPY;  Service: Endoscopy;  Laterality: N/A;    Social History: Patient lives in [place] with his wife.  He recently moved from [facility].  The patient walks unassisted.  He smokes cigarettes and marijuana, denies illicit drug use or alcohol.    No Known Allergies  Family history: family history includes Diabetes in his mother; Hypertension in his maternal grandfather and mother.  Prior to Admission medications   Medication Sig Start Date End Date Taking? Authorizing Provider  atorvastatin (LIPITOR) 20 MG tablet Take 20 mg by mouth at bedtime.   Yes Historical Provider, MD  benzonatate (TESSALON) 200 MG capsule Take 200 mg by mouth 3 (three) times daily as needed for cough.   Yes Historical Provider, MD  busPIRone (BUSPAR) 10 MG tablet Take 1 tablet (10 mg total) by mouth 2 (two) times daily. 09/16/15  Yes Veryl Speak, FNP  cinacalcet (SENSIPAR) 60 MG tablet Take 120 mg by mouth at bedtime.   Yes Historical Provider,  MD  efavirenz (SUSTIVA) 600 MG tablet Take 1 tablet (600 mg total) by mouth at bedtime. 09/24/15  Yes Randall Hiss, MD  Ferric Citrate (AURYXIA) 1 GM 210 MG(Fe) TABS Take 630 mg by mouth 3 (three) times daily with meals.   Yes Historical Provider, MD  folic acid-vitamin b complex-vitamin c-selenium-zinc (DIALYVITE) 3 MG TABS tablet Take 1 tablet by mouth daily.   Yes Historical Provider, MD  lamiVUDine (EPIVIR) 150 MG tablet Take 1 tablet (150 mg total) by mouth 2 (two) times a week. 09/24/15  Yes Randall Hiss, MD    levETIRAcetam (KEPPRA) 500 MG tablet Take 1 tablet (500 mg total) by mouth 2 (two) times daily. 09/16/15  Yes Veryl Speak, FNP  Multiple Vitamins-Iron (MULTIVITAMINS WITH IRON) TABS tablet Take 1 tablet by mouth daily.   Yes Historical Provider, MD  ondansetron (ZOFRAN-ODT) 4 MG disintegrating tablet Take 4 mg by mouth every 8 (eight) hours as needed for nausea or vomiting.   Yes Historical Provider, MD  promethazine (PHENERGAN) 25 MG tablet Take 25 mg by mouth every 6 (six) hours as needed for nausea or vomiting.   Yes Historical Provider, MD  ranitidine (ZANTAC) 150 MG tablet Take 1 tablet (150 mg total) by mouth daily. Patient taking differently: Take 150 mg by mouth 2 (two) times daily.  09/16/15  Yes Veryl Speak, FNP  sucralfate (CARAFATE) 1 g tablet Take 1 tablet (1 g total) by mouth 2 (two) times daily. 09/29/15  Yes Lona Kettle, PA-C  SUMAtriptan (IMITREX) 100 MG tablet Take 0.5-1 tablets by mouth at the onset of a headache. May repeat in 2 hours if headache persists or recurs. 09/16/15  Yes Veryl Speak, FNP  tenofovir (VIREAD) 300 MG tablet Take 1 tablet (300 mg total) by mouth once a week. 09/24/15  Yes Randall Hiss, MD  pantoprazole (PROTONIX) 20 MG tablet Take 2 tablets (40 mg total) by mouth daily. 09/29/15   Lona Kettle, PA-C       Physical Exam: BP 158/94 mmHg  Pulse 86  Temp(Src) 97.8 F (36.6 C) (Oral)  Resp 14  SpO2 95% General appearance: Well-developed, adult male, alert and in no acute distress.  Coughs but no blood.   Eyes: Anicteric, conjunctiva pink, lids and lashes normal.     ENT: No nasal deformity, discharge, or epistaxis.  OP moist without lesions.   Lymph: No cervical or supraclavicular lymphadenopathy. Skin: Warm and dry.  No suspicious rashes or lesions.  No stigmata of cirrhosis. Cardiac: RRR, nl S1-S2, no murmurs appreciated.  Capillary refill is brisk.  JVP not visible.  No LE edema.  Radial pulses 2+ and  symmetric. Respiratory: Normal respiratory rate and rhythm.  CTAB without rales or wheezes. Abdomen: Abdomen soft without rigidity.  No TTP. No ascites, distension.   MSK: No deformities or effusions. Neuro: Sensorium intact and responding to questions, attention normal.  Speech is fluent.  Moves all extremities equally and with normal coordination.    Psych: Behavior appropriate.  Affect normal.  No evidence of aural or visual hallucinations or delusions.       Labs on Admission:  I have personally reviewed following labs and imaging studies: CBC:  Recent Labs Lab 09/29/15 1230 09/30/15 1718  WBC 6.6 6.5  NEUTROABS 4.5  --   HGB 11.7* 10.6*  HCT 35.4* 32.4*  MCV 89.6 88.3  PLT 177 159   Basic Metabolic Panel:  Recent Labs Lab 09/29/15 1230 09/30/15  1718  NA 140 138  K 4.2 3.9  CL 93* 93*  CO2 25 23  GLUCOSE 89 74  BUN 65* 74*  CREATININE 18.96* 21.76*  CALCIUM 8.6* 9.1   GFR: Estimated Creatinine Clearance: 4.5 mL/min (by C-G formula based on Cr of 21.76). Liver Function Tests:  Recent Labs Lab 09/29/15 1230 09/30/15 1718  AST 23 21  ALT 14* 14*  ALKPHOS 235* 229*  BILITOT 0.8 0.8  PROT 8.3* 7.7  ALBUMIN 3.8 3.6   No results for input(s): LIPASE, AMYLASE in the last 168 hours. No results for input(s): AMMONIA in the last 168 hours. Coagulation Profile: No results for input(s): INR, PROTIME in the last 168 hours. Cardiac Enzymes: No results for input(s): CKTOTAL, CKMB, CKMBINDEX, TROPONINI in the last 168 hours. BNP (last 3 results) No results for input(s): PROBNP in the last 8760 hours. HbA1C: No results for input(s): HGBA1C in the last 72 hours. CBG: No results for input(s): GLUCAP in the last 168 hours. Lipid Profile: No results for input(s): CHOL, HDL, LDLCALC, TRIG, CHOLHDL, LDLDIRECT in the last 72 hours. Thyroid Function Tests: No results for input(s): TSH, T4TOTAL, FREET4, T3FREE, THYROIDAB in the last 72 hours. Anemia Panel: No results  for input(s): VITAMINB12, FOLATE, FERRITIN, TIBC, IRON, RETICCTPCT in the last 72 hours. Sepsis Labs:  @[email or web address] (procalcitonin:4,lacticidven:4) ) Recent Results (from the past 240 hour(s))  MRSA PCR Screening     Status: None   Collection Time: 09/23/15  2:30 AM  Result Value Ref Range Status   MRSA by PCR NEGATIVE NEGATIVE Final    Comment:        The GeneXpert MRSA Assay (FDA approved for NASAL specimens only), is one component of a comprehensive MRSA colonization surveillance program. It is not intended to diagnose MRSA infection nor to guide or monitor treatment for MRSA infections.             Assessment/Plan 1. Hematemesis in setting of known ulcer and grade I varices:  Currently BP and HR within normal limits, but Hgb drop by 1.1 g/dL in last 24 hours with continued hematemesis.     -Continue pantoprazole 40 mg IV BID -Will continue octreotide drip, recommended by GI -Ceftriaxone 1g IV for prophylaxis -Consult to GI, appreciate recommendations -Clear liquid diet only -Continue home H2RA -Will order chest x-ray to rule out infiltrates compared to last week, given that he describes this as more "coughing" blood than vomiting   2. HIV:  HIV RNA undetect last month.  Missed establishment of care appt with Dr. Wyonia HoughVan Damme last week while in hospital. -Continue home antiretrovirals  3. HTN:  Hypertensive, not on antihypertensives.  4. ESRD on HD TThS:  Last full HD last Saturday, aborted yesterday for hematemesis. -Consult to nephrology to evaluate for HD while in observation -Continue phosbinders and vitamins -PATIENT REFUSED RENAL DIET  5. Hx of seizures:  -Continue Keppra  6. Combination anemia of renal disease and acute blood loss anemia:  Dizziness with standing.  Baseline Hgb 13.7 in April, 11.7 yesterday -Trend CBC -Monitor for bleeding -Hold iron/non-essential medicines while observation status -Type and screen    DVT prophylaxis: SCDs  Code  Status: FULL  Family Communication: Wife at bedside, brother as well.  Disposition Plan: Anticipate evaluation by GI tomorrow, trend CBC.  If Hgb stable and GI do not recommend repeat endoscopy, patient could conceivably go home tomorrow. Consults called: Nephrology, left message on routine HD voicemail.  [facility], Dr. Madilyn FiremanHayes will have patient seen tomorrow Admission  status: Observation, med surg bed   Medical decision making: Patient seen at 7:25 PM on 09/30/2015.  The patient was discussed with Geralynn Rile, PA_C. Clinical condition: stable.        Alberteen Sam [facility] Pager [phone]

## 2015-09-30 NOTE — ED Notes (Signed)
Pt here with increase in vomiting blood. sts seen here yesterday for the same. Pt dialysis and missed yesterday.

## 2015-10-01 ENCOUNTER — Encounter (HOSPITAL_COMMUNITY): Payer: Self-pay | Admitting: *Deleted

## 2015-10-01 DIAGNOSIS — R110 Nausea: Secondary | ICD-10-CM

## 2015-10-01 DIAGNOSIS — N186 End stage renal disease: Secondary | ICD-10-CM | POA: Diagnosis not present

## 2015-10-01 DIAGNOSIS — K920 Hematemesis: Principal | ICD-10-CM

## 2015-10-01 LAB — CBC
HEMATOCRIT: 33.3 % — AB (ref 39.0–52.0)
HEMOGLOBIN: 11.2 g/dL — AB (ref 13.0–17.0)
MCH: 30.4 pg (ref 26.0–34.0)
MCHC: 33.6 g/dL (ref 30.0–36.0)
MCV: 90.5 fL (ref 78.0–100.0)
Platelets: 176 10*3/uL (ref 150–400)
RBC: 3.68 MIL/uL — AB (ref 4.22–5.81)
RDW: 16.5 % — AB (ref 11.5–15.5)
WBC: 4.7 10*3/uL (ref 4.0–10.5)

## 2015-10-01 LAB — BASIC METABOLIC PANEL
ANION GAP: 22 — AB (ref 5–15)
BUN: 78 mg/dL — ABNORMAL HIGH (ref 6–20)
CALCIUM: 8.5 mg/dL — AB (ref 8.9–10.3)
CO2: 22 mmol/L (ref 22–32)
Chloride: 91 mmol/L — ABNORMAL LOW (ref 101–111)
Creatinine, Ser: 23.25 mg/dL — ABNORMAL HIGH (ref 0.61–1.24)
GFR calc non Af Amer: 2 mL/min — ABNORMAL LOW (ref >60)
GFR, EST AFRICAN AMERICAN: 2 mL/min — AB (ref >60)
Glucose, Bld: 111 mg/dL — ABNORMAL HIGH (ref 65–99)
POTASSIUM: 4.8 mmol/L (ref 3.5–5.1)
Sodium: 135 mmol/L (ref 135–145)

## 2015-10-01 MED ORDER — HYDRALAZINE HCL 20 MG/ML IJ SOLN: 10.0000 mg | Freq: Once | INTRAMUSCULAR | Status: DC

## 2015-10-01 MED ORDER — NA FERRIC GLUC CPLX IN SUCROSE 12.5 MG/ML IV SOLN
62.5000 mg | INTRAVENOUS | Status: DC
  Administered 2015-10-06: 62.5 mg via INTRAVENOUS
  Filled 2015-10-01 (×2): qty 5

## 2015-10-01 MED ORDER — DOXERCALCIFEROL 4 MCG/2ML IV SOLN
4.0000 ug | INTRAVENOUS | Status: DC
  Administered 2015-10-03 – 2015-10-06 (×2): 4 ug via INTRAVENOUS
  Filled 2015-10-01 (×2): qty 2

## 2015-10-01 MED ORDER — LIDOCAINE 4 % EX CREA
TOPICAL_CREAM | Freq: Every day | CUTANEOUS | Status: DC | PRN
  Filled 2015-10-01: qty 5

## 2015-10-01 MED ORDER — SUCROFERRIC OXYHYDROXIDE 500 MG PO CHEW
500.0000 mg | CHEWABLE_TABLET | Freq: Three times a day (TID) | ORAL | Status: DC
  Administered 2015-10-01: 500 mg via ORAL
  Filled 2015-10-01 (×5): qty 1

## 2015-10-01 MED ORDER — LIDOCAINE-PRILOCAINE 2.5-2.5 % EX CREA
1.0000 "application " | TOPICAL_CREAM | CUTANEOUS | Status: DC | PRN
  Administered 2015-10-01: 1 via TOPICAL
  Filled 2015-10-01: qty 5

## 2015-10-01 NOTE — Progress Notes (Signed)
Pt refused velphoro this evening stating that this is not a medicine he takes and does not want it. Continues to be a difficult recipient of care

## 2015-10-01 NOTE — Progress Notes (Signed)
Went to dietary and picked up pt's lunch tray and delivered it to his room.

## 2015-10-01 NOTE — Progress Notes (Signed)
Pt 's wife requesting to speak to the doctor regarding pt's diet.MD paged

## 2015-10-01 NOTE — Progress Notes (Signed)
Patient ID: Robert Bradshaw, male   DOB: 27-Oct-1980, 35 y.o.   MRN: [id number]   PROGRESS NOTE    Robert Bradshaw  WUJ:[id number] DOB: [birth year] DOA: 09/30/2015  PCP: Jeanine Luz, FNP   Outpatient Specialists:   Brief Narrative:  35 y.o. male with a past medical history significant for ESRD on HD TThS, HIV well controlled who presented with hematemesis, last EGD April 2017 with grade I varices and non bleeding ulcer. He missed last HD on Tuesday.   Assessment & Plan:  1. Hematemesis in setting of known ulcer and grade I varices - no further episodes - Hg and Hct have remained stable in the past 24 hours  - no indication for endoscopy at this time per GI team - continue PPI, stop Rocephin in AM if no further bleeding   2. HIV - HIV RNA undetect last month. Missed establishment of care appt with Dr. Daiva Eves last week - Continue home antiretrovirals  3. HTN, essential  - off antihypertensives  - reasonable control so far   4. ESRD on HD TThS - per nephrology team - pt refusing renal diet   5. Hx of seizures - Continue Keppra  6. Combination anemia of renal disease and acute blood loss anemia  - no indication for transfusion  - Hg stable - CBC In AM  DVT prophylaxis: SCD's Code Status: Full  Family Communication: Patient and wife at bedside  Disposition Plan: Home in 1-2 days  Consultants:   Nephrology  GI  Procedures:   GI  Antimicrobials:   Rocephin 5/10 --> stop in am if no further bleeding    Subjective: Feels tired but denies chest pain or shortness of breath.   Objective: Filed Vitals:   09/30/15 2030 09/30/15 2155 10/01/15 0623 10/01/15 1442  BP: 159/88 138/83 159/68 133/79  Pulse: 90 82 81 83  Temp:  98 F (36.7 C) 98.6 F (37 C) 98.3 F (36.8 C)  TempSrc:  Oral Axillary Axillary  Resp: SpO2: 97% 97% 95% 95%    Intake/Output Summary (Last 24 hours) at 10/01/15 1542 Last data filed at 10/01/15 1210  Gross per 24 hour  Intake     360 ml  Output      0 ml  Net    360 ml   There were no vitals filed for this visit.  Examination:  General exam: Appears calm and comfortable  Respiratory system: Clear to auscultation. Respiratory effort normal. Cardiovascular system: S1 & S2 heard, RRR. No JVD, rubs, gallops or clicks. No pedal edema. Gastrointestinal system: Abdomen is nondistended, soft and nontender.   Data Reviewed: I have personally reviewed following labs and imaging studies  CBC:  Recent Labs Lab 09/29/15 1230 09/30/15 1718 10/01/15 0727  WBC 6.6 6.5 4.7  NEUTROABS 4.5  --   --   HGB 11.7* 10.6* 11.2*  HCT 35.4* 32.4* 33.3*  MCV 89.6 88.3 90.5  PLT 177 159 176   Basic Metabolic Panel:  Recent Labs Lab 09/29/15 1230 09/30/15 1718 10/01/15 0727  NA 140 138 135  K 4.2 3.9 4.8  CL 93* 93* 91*  CO2 GLUCOSE 89 74 111*  BUN 65* 74* 78*  CREATININE 18.96* 21.76* 23.25*  CALCIUM 8.6* 9.1 8.5*   Liver Function Tests:  Recent Labs Lab 09/29/15 1230 09/30/15 1718  AST 23 21  ALT 14* 14*  ALKPHOS 235* 229*  BILITOT 0.8 0.8  PROT 8.3* 7.7  ALBUMIN 3.8 3.6  Recent Results (from the past 240 hour(s))  MRSA PCR Screening     Status: None   Collection Time: 09/23/15  2:30 AM  Result Value Ref Range Status   MRSA by PCR NEGATIVE NEGATIVE Final    Radiology Studies: X-ray Chest Pa And Lateral 09/30/2015  No active cardiopulmonary disease.    Scheduled Meds: . busPIRone  10 mg Oral BID  . cefTRIAXone (ROCEPHIN)  IV  1 g Intravenous Q24H  . cinacalcet  120 mg Oral Q breakfast  . doxercalciferol  4 mcg Intravenous Q T,Th,Sa-HD  . efavirenz  600 mg Oral QHS  . famotidine  20 mg Oral Daily  . ferric gluconate (FERRLECIT/NULECIT) IV  62.5 mg Intravenous Q Thu-HD  . lamiVUDine  150 mg Oral Once per day on Mon Thu  . levETIRAcetam  500 mg Oral BID  . multivitamins with iron  1 tablet Oral Daily  . pantoprazole (PROTONIX) IV  40 mg Intravenous Q12H  . sucroferric oxyhydroxide   500 mg Oral TID WC  . tenofovir  300 mg Oral Weekly   Continuous Infusions: . octreotide  (SANDOSTATIN)    IV infusion 50 mcg/hr (10/01/15 0803)   Time spent: 20 minutes   Debbora PrestoMAGICK-Nevah Dalal, MD [facility] Pager [phone]  If 7PM-7AM, please contact night-coverage [email or web address] Password [email or web address] 10/01/2015, 3:42 PM

## 2015-10-01 NOTE — Progress Notes (Signed)
Pt says he is unhappy that his hemodialysis has not been done yet and that he has been waiting all day to have HD. Called HD nurse and pt spoke to the HD nurse himself

## 2015-10-01 NOTE — Consult Note (Signed)
Subjective:   HPI  The patient is a 35 year old male with multiple medical problems including end-stage renal disease on hemodialysis and a history of HIV. He was admitted to the hospital after presenting to the emergency room with complaints of coughing up blood and vomiting up blood. He states that he gets coughing spells and when he gets coughing spells he starts coughing up blood then he starts vomiting as well and there is some blood in that as well. He was in the hospital a week or so ago for the same problem and had an EGD on May 3 which showed a very small prepyloric antral ulcer with no stigmata of bleeding and grade 1 small esophageal varices also with no stigmata of bleeding. Yesterday at hemodialysis he had a coughing. He denies abdominal pain. After his last hospitalization a week ago he left AMA without receiving any medications such as PPI therapy. When he came back to the emergency room a couple days ago they gave him a prescription to take for pantoprazole. His chest x-ray is without acute abnormality.  Review of Systems Denies chest pain or shortness of breath  Past Medical History  Diagnosis Date  . Renal disorder   . Hypertension   . Immune deficiency disorder (HCC)   . HIV (human immunodeficiency virus infection) (HCC)   . GERD (gastroesophageal reflux disease)   . Migraines   . Parathyroid abnormality [facility](HCC)    Past Surgical History  Procedure Laterality Date  . Av fistula placement    . Esophagogastroduodenoscopy (egd) with propofol N/A 09/23/2015    Procedure: ESOPHAGOGASTRODUODENOSCOPY (EGD) WITH PROPOFOL;  Surgeon: Graylin ShiverSalem F Aditri Louischarles, MD;  [facility];  Service: Endoscopy;  Laterality: N/A;   Social History   Social History  . Marital Status: Married    Spouse Name: N/A  . Number of Children: 0  . Years of Education: 10   Occupational History  . Disability    Social History Main Topics  . Smoking status: Current Every Day Smoker -- 0.50 packs/day for  20 years    Types: Cigarettes  . Smokeless tobacco: Not on file  . Alcohol Use: No  . Drug Use: 7.00 per week    Special: Marijuana  . Sexual Activity: Not on file   Other Topics Concern  . Not on file   Social History Narrative   Fun: Fish and hunt   family history includes Diabetes in his mother; Hypertension in his maternal grandfather and mother.  Current facility-administered medications:  .  acetaminophen (TYLENOL) tablet 650 mg, 650 mg, Oral, Q6H PRN, 650 mg at 09/30/15 2003 **OR** acetaminophen (TYLENOL) suppository 650 mg, 650 mg, Rectal, Q6H PRN, Alberteen Samhristopher P Danford, MD .  busPIRone (BUSPAR) tablet 10 mg, 10 mg, Oral, BID, Alberteen Samhristopher P Danford, MD, 10 mg at 09/30/15 2349 .  cefTRIAXone (ROCEPHIN) 1 g in dextrose 5 % 50 mL IVPB, 1 g, Intravenous, Q24H, Alberteen Samhristopher P Danford, MD, 1 g at 09/30/15 2308 .  cinacalcet (SENSIPAR) tablet 120 mg, 120 mg, Oral, Q breakfast, Lauren D Bajbus, RPH .  doxercalciferol (HECTOROL) injection 4 mcg, 4 mcg, Intravenous, Q T,Th,Sa-HD, Weston SettleMartha Bergman, PA-C .  efavirenz (SUSTIVA) tablet 600 mg, 600 mg, Oral, QHS, Alberteen Samhristopher P Danford, MD, 600 mg at 09/30/15 2308 .  famotidine (PEPCID) tablet 20 mg, 20 mg, Oral, Daily, Alberteen Samhristopher P Danford, MD, 20 mg at 10/01/15 0654 .  lamiVUDine (EPIVIR) tablet 150 mg, 150 mg, Oral, Once per day on Mon Thu, Alberteen Samhristopher P Danford, MD .  levETIRAcetam (KEPPRA) tablet 500 mg, 500 mg, Oral, BID, Alberteen Sam, MD, 500 mg at 09/30/15 2308 .  multivitamins with iron tablet 1 tablet, 1 tablet, Oral, Daily, Alberteen Sam, MD .  octreotide (SANDOSTATIN) 500 mcg in sodium chloride 0.9 % 250 mL (2 mcg/mL) infusion, 50 mcg/hr, Intravenous, Continuous, Melton Krebs, [place], Last Rate: 25 mL/hr at 10/01/15 0803, 50 mcg/hr at 10/01/15 0803 .  ondansetron (ZOFRAN) tablet 4 mg, 4 mg, Oral, Q6H PRN **OR** ondansetron (ZOFRAN) injection 4 mg, 4 mg, Intravenous, Q6H PRN, Alberteen Sam, MD .   pantoprazole (PROTONIX) injection 40 mg, 40 mg, Intravenous, Q12H, Alberteen Sam, MD .  tenofovir (VIREAD) tablet 300 mg, 300 mg, Oral, Weekly, Alberteen Sam, MD No Known Allergies   Objective:     BP 159/68 mmHg  Pulse 81  Temp(Src) 98.6 F (37 C) (Axillary)  Resp 16  SpO2 95%  No distress  Heart regular rhythm no murmurs  Lungs clear  Abdomen: Bowel sounds normal, soft, nontender  Laboratory No components found for: D1    Assessment:     Hemoptysis/hematemesis  Multiple other medical problems      Plan:     In obtaining the history from this patient it seems like he develops a coughing spell and then starts coughing up blood this seems to be the principal complaint. I wonder then if he is swallowing of blood that he is coughing and then vomiting that. We did an EGD on him 8 days ago with findings of the small ulcer in the antrum of the stomach and small esophageal varices neither of which had any stigmata of bleeding. He left AMA, and was not sent home on any PPI therapy. He has now been started on PPI therapy as well as empiric octreotide. There has been no drop in hemoglobin and hematocrit. I am not sure what is causing his coughing spells and hemoptysis. It might be reasonable to ask pulmonary to see him in regards to this. From a GI standpoint I don't think he needs another EGD at this time. I would recommend continued PPI therapy. This does not sound like a variceal bleed. Would recommend continued medical therapy for now and observation. Consider pulmonary consult for hemoptysis and coughing spells. Lab Results  Component Value Date   HGB 11.2* 10/01/2015   HGB 10.6* 09/30/2015   HGB 11.7* 09/29/2015   HCT 33.3* 10/01/2015   HCT 32.4* 09/30/2015   HCT 35.4* 09/29/2015   ALKPHOS 229* 09/30/2015   ALKPHOS 235* 09/29/2015   ALKPHOS 241* 09/23/2015   AST 21 09/30/2015   AST 23 09/29/2015   AST 18 09/23/2015   ALT 14* 09/30/2015   ALT 14*  09/29/2015   ALT 15* 09/23/2015

## 2015-10-01 NOTE — Progress Notes (Signed)
Hydralazine 10mg  iv entered in patient's chart in error. Med discontinued

## 2015-10-01 NOTE — Consult Note (Signed)
[facility] Renal Consultation Note    Indication for Consultation:  Management of ESRD/hemodialysis; anemia, hypertension/volume and secondary hyperparathyroidism PCP: Jeanine Luz, FNP   HPI: Robert Bradshaw is a 35 y.o. male with ESRD secondary to HTN on TTS HD at [facility] with PMHx for cocaine and marijuana abuse, HIV, HTN, GERD who was just admitted 5/2 for hematemesis, had an EGD 5/3 that showed grade 1 esophogeal varicies and nonbleeding gastric ulcer  and then left AMA. 5/4 without any Rx.Marland Kitchen  He returned to dialysis Tuesday, vomited blood associated with coughing spell in the waiting room  Pre HD and decided to leave and go to the ED 5/9.  He was evaluated and discharged with recommendation to f/u with GI clinic and continue medical management with PPI and carafate.  He returned again 5/10 to the ED with a history of repeated hematemesis (coughing not vomiting blood), lightheadedness and came to the ED for evaluation. He is currently in OBS status but the primary states he is changing to inpatient status due to the fact he is on an octreotide drip. GI was consulted. He has no abdominal pain. Of note is that there is very little change in spite of these events.  However, his Cr is elevated at 23.25 consistent with uremia due to inadequate. HD. He denies SOB, nausea or vomiting per se. He is hungry and states he hasn't eaten in three days. He states his access is always swollen.  He is demanding that he eat before he goes to dialysis and he must have EMLA cream to his access.  His wife has requested a regular diet for him.  Past Medical History  Diagnosis Date  . Renal disorder   . Hypertension   . Immune deficiency disorder (HCC)   . HIV (human immunodeficiency virus infection) (HCC)   . GERD (gastroesophageal reflux disease)   . Migraines   . Parathyroid abnormality [facility])    Past Surgical History  Procedure Laterality Date  . Av fistula placement    . Esophagogastroduodenoscopy  (egd) with propofol N/A 09/23/2015    Procedure: ESOPHAGOGASTRODUODENOSCOPY (EGD) WITH PROPOFOL;  Surgeon: Graylin Shiver, MD;  Location: [facility] ENDOSCOPY;  Service: Endoscopy;  Laterality: N/A;   Family History  Problem Relation Age of Onset  . Hypertension Mother   . Diabetes Mother   . Hypertension Maternal Grandfather    Social History:  reports that he has been smoking Cigarettes.  He has a 10 pack-year smoking history. He does not have any smokeless tobacco history on file. He reports that he uses illicit drugs (Marijuana) about 7 times per week. He reports that he does not drink alcohol. No Known Allergies Prior to Admission medications   Medication Sig Start Date End Date Taking? Authorizing Provider  atorvastatin (LIPITOR) 20 MG tablet Take 20 mg by mouth at bedtime.   Yes Historical Provider, MD  benzonatate (TESSALON) 200 MG capsule Take 200 mg by mouth 3 (three) times daily as needed for cough.   Yes Historical Provider, MD  busPIRone (BUSPAR) 10 MG tablet Take 1 tablet (10 mg total) by mouth 2 (two) times daily. 09/16/15  Yes Veryl Speak, FNP  cinacalcet (SENSIPAR) 60 MG tablet Take 120 mg by mouth at bedtime.   Yes Historical Provider, MD  efavirenz (SUSTIVA) 600 MG tablet Take 1 tablet (600 mg total) by mouth at bedtime. 09/24/15  Yes Randall Hiss, MD  Ferric Citrate (AURYXIA) 1 GM 210 MG(Fe) TABS Take 630 mg by mouth  3 (three) times daily with meals.   Yes Historical Provider, MD  folic acid-vitamin b complex-vitamin c-selenium-zinc (DIALYVITE) 3 MG TABS tablet Take 1 tablet by mouth daily.   Yes Historical Provider, MD  lamiVUDine (EPIVIR) 150 MG tablet Take 1 tablet (150 mg total) by mouth 2 (two) times a week. 09/24/15  Yes Randall Hissornelius N Van Dam, MD  levETIRAcetam (KEPPRA) 500 MG tablet Take 1 tablet (500 mg total) by mouth 2 (two) times daily. 09/16/15  Yes Veryl SpeakGregory D Calone, FNP  Multiple Vitamins-Iron (MULTIVITAMINS WITH IRON) TABS tablet Take 1 tablet by mouth daily.   Yes  Historical Provider, MD  ondansetron (ZOFRAN-ODT) 4 MG disintegrating tablet Take 4 mg by mouth every 8 (eight) hours as needed for nausea or vomiting.   Yes Historical Provider, MD  promethazine (PHENERGAN) 25 MG tablet Take 25 mg by mouth every 6 (six) hours as needed for nausea or vomiting.   Yes Historical Provider, MD  ranitidine (ZANTAC) 150 MG tablet Take 1 tablet (150 mg total) by mouth daily. Patient taking differently: Take 150 mg by mouth 2 (two) times daily.  09/16/15  Yes Veryl SpeakGregory D Calone, FNP  sucralfate (CARAFATE) 1 g tablet Take 1 tablet (1 g total) by mouth 2 (two) times daily. 09/29/15  Yes Lona KettleAshley Laurel Meyer, PA-C  SUMAtriptan (IMITREX) 100 MG tablet Take 0.5-1 tablets by mouth at the onset of a headache. May repeat in 2 hours if headache persists or recurs. 09/16/15  Yes Veryl SpeakGregory D Calone, FNP  tenofovir (VIREAD) 300 MG tablet Take 1 tablet (300 mg total) by mouth once a week. 09/24/15  Yes Randall Hissornelius N Van Dam, MD  pantoprazole (PROTONIX) 20 MG tablet Take 2 tablets (40 mg total) by mouth daily. 09/29/15   Lona KettleAshley Laurel Meyer, PA-C   Current Facility-Administered Medications  Medication Dose Route Frequency Provider Last Rate Last Dose  . acetaminophen (TYLENOL) tablet 650 mg  650 mg Oral Q6H PRN Alberteen Samhristopher P Danford, MD   650 mg at 09/30/15 2003   Or  . acetaminophen (TYLENOL) suppository 650 mg  650 mg Rectal Q6H PRN Alberteen Samhristopher P Danford, MD      . busPIRone (BUSPAR) tablet 10 mg  10 mg Oral BID Alberteen Samhristopher P Danford, MD   10 mg at 10/01/15 0953  . cefTRIAXone (ROCEPHIN) 1 g in dextrose 5 % 50 mL IVPB  1 g Intravenous Q24H Alberteen Samhristopher P Danford, MD   1 g at 09/30/15 2308  . cinacalcet (SENSIPAR) tablet 120 mg  120 mg Oral Q breakfast Lauren D Bajbus, RPH      . doxercalciferol (HECTOROL) injection 4 mcg  4 mcg Intravenous Q T,Th,Sa-HD Weston SettleMartha Bergman, PA-C      . efavirenz (SUSTIVA) tablet 600 mg  600 mg Oral QHS Alberteen Samhristopher P Danford, MD   600 mg at 09/30/15 2308  . famotidine  (PEPCID) tablet 20 mg  20 mg Oral Daily Alberteen Samhristopher P Danford, MD   20 mg at 10/01/15 0654  . lamiVUDine (EPIVIR) tablet 150 mg  150 mg Oral Once per day on Mon Thu Alberteen Samhristopher P Danford, MD      . levETIRAcetam (KEPPRA) tablet 500 mg  500 mg Oral BID Alberteen Samhristopher P Danford, MD   500 mg at 10/01/15 0953  . multivitamins with iron tablet 1 tablet  1 tablet Oral Daily Alberteen Samhristopher P Danford, MD   1 tablet at 10/01/15 0953  . octreotide (SANDOSTATIN) 500 mcg in sodium chloride 0.9 % 250 mL (2 mcg/mL) infusion  50 mcg/hr Intravenous Continuous  Melton Krebs, PA-C 25 mL/hr at 10/01/15 0803 50 mcg/hr at 10/01/15 0803  . ondansetron (ZOFRAN) tablet 4 mg  4 mg Oral Q6H PRN Alberteen Sam, MD       Or  . ondansetron (ZOFRAN) injection 4 mg  4 mg Intravenous Q6H PRN Alberteen Sam, MD      . pantoprazole (PROTONIX) injection 40 mg  40 mg Intravenous Q12H Alberteen Sam, MD   40 mg at 10/01/15 0953  . tenofovir (VIREAD) tablet 300 mg  300 mg Oral Weekly Alberteen Sam, MD       Labs: Basic Metabolic Panel:  Recent Labs Lab 09/29/15 1230 09/30/15 1718 10/01/15 0727  NA 140 138 135  K 4.2 3.9 4.8  CL 93* 93* 91*  CO2 25 23 22   GLUCOSE 89 74 111*  BUN 65* 74* 78*  CREATININE 18.96* 21.76* 23.25*  CALCIUM 8.6* 9.1 8.5*   Liver Function Tests:  Recent Labs Lab 09/29/15 1230 09/30/15 1718  AST 23 21  ALT 14* 14*  ALKPHOS 235* 229*  BILITOT 0.8 0.8  PROT 8.3* 7.7  ALBUMIN 3.8 3.6   CBC:  Recent Labs Lab 09/29/15 1230 09/30/15 1718 10/01/15 0727  WBC 6.6 6.5 4.7  NEUTROABS 4.5  --   --   HGB 11.7* 10.6* 11.2*  HCT 35.4* 32.4* 33.3*  MCV 89.6 88.3 90.5  PLT 177 159 176  Studies/Results: X-ray Chest Pa And Lateral  09/30/2015  CLINICAL DATA:  35 year old male with hemoptysis EXAM: CHEST  2 VIEW COMPARISON:  Chest radiograph dated 09/22/2015 FINDINGS: Two views of the chest demonstrate clear lungs. There is no pleural effusion or pneumothorax.  Stable left hilar and lingular calcified granuloma. The cardiac silhouette is within normal limits. No acute osseous pathology. IMPRESSION: No active cardiopulmonary disease. Electronically Signed   By: Elgie Collard M.D.   On: 09/30/2015 23:05    ROS: As per HPI otherwise negative.  Physical Exam: Filed Vitals:   09/30/15 2000 09/30/15 2030 09/30/15 2155 10/01/15 0623  BP: 173/91 159/88 138/83 159/68  Pulse: 87 90 82 81  Temp:   98 F (36.7 C) 98.6 F (37 C)  TempSrc:   Oral Axillary  Resp:  15 16 16   SpO2: 96% 97% 97% 95%     General: Well developed, well nourished, in no acute distress on room air Head: Normocephalic, atraumatic, sclera non-icteric, mucus membranes are moist some facial puffiness Neck: Supple.  Lungs: Clear bilaterally to auscultation without wheezes, rales, or rhonchi. Breathing is unlabored. Heart: RRR with S1 S2. No murmurs, rubs, or gallops appreciated. Abdomen: Soft, non-tender, non-distended with normoactive bowel sounds. No rebound/guarding. No obvious abdominal masses. M-S:  Strength and tone appear normal for age. Lower extremities:without edema or ischemic changes, no open wounds  Neuro: Alert and oriented X 3. Moves all extremities spontaneously. Dialysis Access: AVGG forearm - chronic swelling- per pt - + bruit  Dialysis Orders:  4 hrs 15 min 180NRe Optiflux  BFR 400, DFR 800  EDW 77.5 (kg) 2.0 K, 2.25 Ca,  Sodium Model: Linear LFA AVG Heparin 8000 units IV Q treatment- stopped after last adm when he left AMA Hectoral 4 mcg IV q treatment Venofer 50mg  IV q treatment  Assessment/Plan: 1.  Recurrent hematemesis - usually associated with coughing - no significant change in hgb; GI following on PPI, H2 blocker and octreotide drip 2.  ESRD -  TTS - ^^ BUN/Cr due to lack of dialysis ; serial HD to lower numbers;  plan HD today 3 hour due to high volume and again on Friday and Saturday;; HD further delayed due to his desire to eat; recent  noncompliance with length of treatments; EMLA ordered 3.  Hypertension/volume  - needs decreased volume 4.  Anemia  - montior hgb weekly Fe ; no ESA needed yet 5.  Metabolic bone disease -  cotninue current meds, hectorol -iPTH 2400 - uncontrolled;- on aurixya as outpt - change to velphoro here 6.  Nutrition- changed to regular diet per wife's request 7. HIV- meds per primary 8. Hx of polysubstance abuse 9. Hx of dialysis noncompliance 10. Access- hx of right sub occlusion 08/2015 venogram probably contributing to R forearm swelling, facial puffiness; need to watch kinetics; has only had one AF 1100 5/6  Sheffield Slider, PA-C [facility] Beeper [phone] 10/01/2015, 10:56 AM      I have seen and examined this patient and agree with plan as outlined by Bard Herbert, PA-C.  Will plan for serial dialysis to help with volume. Jomarie Longs A Tylie Golonka,MD 10/01/2015 3:12 PM

## 2015-10-01 NOTE — Care Management Obs Status (Signed)
MEDICARE OBSERVATION STATUS NOTIFICATION   Patient Details  Name: Robert BernheimJoe Bradshaw MRN: [id number] Date of Birth: 01/12/1981   Medicare Observation Status Notification Given:  Yes (From a GI standpoint I don't think he needs another EGD at this time. I would recommend continued PPI therapy. This does not sound like a variceal bleed. Would recommend continued medical therapy for now and observation. Consider pulmonary consult for he)    Kingsley PlanWile, Dammon Makarewicz Marie, RN 10/01/2015, 10:52 AM

## 2015-10-01 NOTE — Progress Notes (Signed)
Pt and his wife requested that i go and pick up his lunch tray from dietary. Informed them that i will get caught up with meds and other tasks on my worklist and then go and get the meal tray for them

## 2015-10-01 NOTE — Progress Notes (Signed)
Pt and his wife just seem to have multiple issues of complaint despite being attended to every request they make.

## 2015-10-01 NOTE — Progress Notes (Signed)
Paged MD and pt's wife spoke to MD about diet orders. Pt now on a regular diet

## 2015-10-02 DIAGNOSIS — Z79899 Other long term (current) drug therapy: Secondary | ICD-10-CM | POA: Diagnosis not present

## 2015-10-02 DIAGNOSIS — B20 Human immunodeficiency virus [HIV] disease: Secondary | ICD-10-CM

## 2015-10-02 DIAGNOSIS — E875 Hyperkalemia: Secondary | ICD-10-CM | POA: Diagnosis not present

## 2015-10-02 DIAGNOSIS — H9222 Otorrhagia, left ear: Secondary | ICD-10-CM | POA: Diagnosis not present

## 2015-10-02 DIAGNOSIS — N2581 Secondary hyperparathyroidism of renal origin: Secondary | ICD-10-CM | POA: Diagnosis present

## 2015-10-02 DIAGNOSIS — E7800 Pure hypercholesterolemia, unspecified: Secondary | ICD-10-CM | POA: Diagnosis present

## 2015-10-02 DIAGNOSIS — Z6830 Body mass index (BMI) 30.0-30.9, adult: Secondary | ICD-10-CM | POA: Diagnosis not present

## 2015-10-02 DIAGNOSIS — F1210 Cannabis abuse, uncomplicated: Secondary | ICD-10-CM | POA: Diagnosis present

## 2015-10-02 DIAGNOSIS — Z992 Dependence on renal dialysis: Secondary | ICD-10-CM | POA: Diagnosis not present

## 2015-10-02 DIAGNOSIS — K920 Hematemesis: Secondary | ICD-10-CM | POA: Diagnosis present

## 2015-10-02 DIAGNOSIS — F17210 Nicotine dependence, cigarettes, uncomplicated: Secondary | ICD-10-CM | POA: Diagnosis present

## 2015-10-02 DIAGNOSIS — E669 Obesity, unspecified: Secondary | ICD-10-CM | POA: Diagnosis present

## 2015-10-02 DIAGNOSIS — Z452 Encounter for adjustment and management of vascular access device: Secondary | ICD-10-CM | POA: Diagnosis not present

## 2015-10-02 DIAGNOSIS — N186 End stage renal disease: Secondary | ICD-10-CM | POA: Diagnosis present

## 2015-10-02 DIAGNOSIS — T827XXA Infection and inflammatory reaction due to other cardiac and vascular devices, implants and grafts, initial encounter: Secondary | ICD-10-CM | POA: Diagnosis present

## 2015-10-02 DIAGNOSIS — Z21 Asymptomatic human immunodeficiency virus [HIV] infection status: Secondary | ICD-10-CM | POA: Diagnosis present

## 2015-10-02 DIAGNOSIS — K219 Gastro-esophageal reflux disease without esophagitis: Secondary | ICD-10-CM | POA: Diagnosis present

## 2015-10-02 DIAGNOSIS — Y832 Surgical operation with anastomosis, bypass or graft as the cause of abnormal reaction of the patient, or of later complication, without mention of misadventure at the time of the procedure: Secondary | ICD-10-CM | POA: Diagnosis present

## 2015-10-02 DIAGNOSIS — I120 Hypertensive chronic kidney disease with stage 5 chronic kidney disease or end stage renal disease: Secondary | ICD-10-CM | POA: Diagnosis present

## 2015-10-02 DIAGNOSIS — D696 Thrombocytopenia, unspecified: Secondary | ICD-10-CM | POA: Diagnosis not present

## 2015-10-02 DIAGNOSIS — T82898A Other specified complication of vascular prosthetic devices, implants and grafts, initial encounter: Secondary | ICD-10-CM

## 2015-10-02 DIAGNOSIS — E8889 Other specified metabolic disorders: Secondary | ICD-10-CM | POA: Diagnosis present

## 2015-10-02 DIAGNOSIS — R569 Unspecified convulsions: Secondary | ICD-10-CM | POA: Diagnosis present

## 2015-10-02 DIAGNOSIS — D62 Acute posthemorrhagic anemia: Secondary | ICD-10-CM | POA: Diagnosis present

## 2015-10-02 DIAGNOSIS — D631 Anemia in chronic kidney disease: Secondary | ICD-10-CM | POA: Diagnosis present

## 2015-10-02 DIAGNOSIS — I871 Compression of vein: Secondary | ICD-10-CM | POA: Diagnosis present

## 2015-10-02 DIAGNOSIS — R21 Rash and other nonspecific skin eruption: Secondary | ICD-10-CM | POA: Diagnosis not present

## 2015-10-02 MED ORDER — OXYCODONE HCL 5 MG PO TABS
5.0000 mg | ORAL_TABLET | ORAL | Status: DC | PRN
  Administered 2015-10-02 – 2015-10-07 (×15): 5 mg via ORAL
  Filled 2015-10-02 (×15): qty 1

## 2015-10-02 MED ORDER — LIDOCAINE HCL (PF) 1 % IJ SOLN: 5.0000 mL | INTRAMUSCULAR | Status: DC | PRN

## 2015-10-02 MED ORDER — SODIUM CHLORIDE 0.9 % IV SOLN: 100.0000 mL | INTRAVENOUS | Status: DC | PRN

## 2015-10-02 MED ORDER — OXYCODONE HCL 5 MG PO TABS
5.0000 mg | ORAL_TABLET | ORAL | Status: AC | PRN
  Administered 2015-10-02 (×2): 5 mg via ORAL
  Filled 2015-10-02 (×2): qty 1

## 2015-10-02 MED ORDER — HEPARIN SODIUM (PORCINE) 1000 UNIT/ML DIALYSIS: 1000.0000 [IU] | INTRAMUSCULAR | Status: DC | PRN

## 2015-10-02 MED ORDER — ALTEPLASE 2 MG IJ SOLR: 2.0000 mg | Freq: Once | INTRAMUSCULAR | Status: DC | PRN

## 2015-10-02 MED ORDER — PENTAFLUOROPROP-TETRAFLUOROETH EX AERO: 1.0000 "application " | INHALATION_SPRAY | CUTANEOUS | Status: DC | PRN

## 2015-10-02 MED ORDER — SODIUM CHLORIDE 0.9 % IV SOLN
100.0000 mL | INTRAVENOUS | Status: DC | PRN
Start: 2015-10-02 — End: 2015-10-03

## 2015-10-02 MED ORDER — SODIUM CHLORIDE 0.9 % IV SOLN
100.0000 mL | INTRAVENOUS | Status: DC | PRN
  Administered 2015-10-03: 08:00:00 via INTRAVENOUS

## 2015-10-02 MED ORDER — LIDOCAINE-PRILOCAINE 2.5-2.5 % EX CREA: 1.0000 "application " | TOPICAL_CREAM | CUTANEOUS | Status: DC | PRN

## 2015-10-02 MED ORDER — HEPARIN SODIUM (PORCINE) 1000 UNIT/ML DIALYSIS
1000.0000 [IU] | INTRAMUSCULAR | Status: DC | PRN
Start: 2015-10-02 — End: 2015-10-02

## 2015-10-02 NOTE — Progress Notes (Signed)
Pt. Stated he did not wanted to get a bath today. He stated he just wants rest

## 2015-10-02 NOTE — Progress Notes (Signed)
Late entry- at beginning of shift last pm, patient and wife asking when patient would go to dialysis. I spoke with dialysis nurse, Suzette BattiestVeronica, who said it would be midnight or later.  Patient and wife upset about this.  Patient wanted to see MD.  Stated he might leave.  I spoke with L. Harduk from [facility] and Dr. Abel Prestoolodonato.  Informed patient that I had spoken with these clinicians and his dialysis would not be until midnight at the earliest.  Patient upset.  After I left the room, I heard a loud noise and returned to patient's room to find he had thrown his dinner tray on the floor.  Subsequently, patient's wife persuaded patient to go to dialysis that night.  He did go around 0030, but returned soon after because of painful cannulation .

## 2015-10-02 NOTE — Progress Notes (Signed)
Patient ID: Robert Bradshaw, male   DOB: [birth year], 35 y.o.   MRN: [id number]   PROGRESS NOTE    Robert Bradshaw  WUJ:[id number] DOB: 1981-02-24 DOA: 09/30/2015  PCP: Jeanine Luz, FNP   Outpatient Specialists:   Brief Narrative:  35 y.o. male with a past medical history significant for ESRD on HD TThS, HIV well controlled who presented with hematemesis, last EGD April 2017 with grade I varices and non bleeding ulcer. He missed last HD on Tuesday.   Assessment & Plan:  1. Hematemesis in setting of known ulcer and grade I varices - no further episodes - Hg and Hct have remained stable in the past 48 hours  - no indication for endoscopy at this time per GI team - continue PPI, stop Rocephin  2. HIV - HIV RNA undetect last month. Missed establishment of care appt with Dr. Daiva Eves last week - Continue home antiretrovirals  3. HTN, essential  - off antihypertensives  - reasonable control so far   4. ESRD on HD TThS - per nephrology team - malfunctioning right forarm graft, needs HD catheter, vascular surgery consulted   5. Hx of seizures - Continue Keppra  6. Combination anemia of renal disease and acute blood loss anemia  - no indication for transfusion  - Hg stable - CBC In AM  7. Access- hx of right sub occlusion 08/2015 venogram  - vascular surgery consulted for assistance   DVT prophylaxis: SCD's Code Status: Full  Family Communication: Patient and wife at bedside  Disposition Plan: Home in 1-2 days  Consultants:   Nephrology  GI  Procedures:   GI  Antimicrobials:  Rocephin 5/10 --> 5/12  Subjective: Feels tired but denies chest pain or shortness of breath.   Objective: Filed Vitals:   10/01/15 0623 10/01/15 1442 10/02/15 0214 10/02/15 0539  BP: 159/68 133/79 151/81 140/84  Pulse: 81 83 86 81  Temp: 98.6 F (37 C) 98.3 F (36.8 C) 98 F (36.7 C) 98 F (36.7 C)  TempSrc: Axillary Axillary Oral Axillary  Resp: SpO2: 95% 95% 98% 100%     Intake/Output Summary (Last 24 hours) at 10/02/15 1307 Last data filed at 10/02/15 1217  Gross per 24 hour  Intake    340 ml  Output      0 ml  Net    340 ml   Examination:  General exam: Appears calm and comfortable  Respiratory system: Clear to auscultation. Respiratory effort normal. Cardiovascular system: S1 & S2 heard, RRR. No JVD, rubs, gallops or clicks. No pedal edema. Gastrointestinal system: Abdomen is nondistended, soft and nontender.  Dialysis Access: AVGG forearm - chronic swelling- per pt - + bruit  Data Reviewed: I have personally reviewed following labs and imaging studies  CBC:  Recent Labs Lab 09/29/15 1230 09/30/15 1718 10/01/15 0727  WBC 6.6 6.5 4.7  NEUTROABS 4.5  --   --   HGB 11.7* 10.6* 11.2*  HCT 35.4* 32.4* 33.3*  MCV 89.6 88.3 90.5  PLT 177 159 176   Basic Metabolic Panel:  Recent Labs Lab 09/29/15 1230 09/30/15 1718 10/01/15 0727  NA 140 138 135  K 4.2 3.9 4.8  CL 93* 93* 91*  CO2 GLUCOSE 89 74 111*  BUN 65* 74* 78*  CREATININE 18.96* 21.76* 23.25*  CALCIUM 8.6* 9.1 8.5*   Liver Function Tests:  Recent Labs Lab 09/29/15 1230 09/30/15 1718  AST 23 21  ALT 14* 14*  ALKPHOS  235* 229*  BILITOT 0.8 0.8  PROT 8.3* 7.7  ALBUMIN 3.8 3.6   Recent Results (from the past 240 hour(s))  MRSA PCR Screening     Status: None   Collection Time: 09/23/15  2:30 AM  Result Value Ref Range Status   MRSA by PCR NEGATIVE NEGATIVE Final    Radiology Studies: X-ray Chest Pa And Lateral 09/30/2015  No active cardiopulmonary disease.    Scheduled Meds: . busPIRone  10 mg Oral BID  . cefTRIAXone (ROCEPHIN)  IV  1 g Intravenous Q24H  . cinacalcet  120 mg Oral Q breakfast  . doxercalciferol  4 mcg Intravenous Q T,Th,Sa-HD  . efavirenz  600 mg Oral QHS  . famotidine  20 mg Oral Daily  . ferric gluconate (FERRLECIT/NULECIT) IV  62.5 mg Intravenous Q Thu-HD  . lamiVUDine  150 mg Oral Once per day on Mon Thu  . levETIRAcetam   500 mg Oral BID  . multivitamins with iron  1 tablet Oral Daily  . pantoprazole (PROTONIX) IV  40 mg Intravenous Q12H  . sucroferric oxyhydroxide  500 mg Oral TID WC  . tenofovir  300 mg Oral Weekly   Continuous Infusions: . octreotide  (SANDOSTATIN)    IV infusion 50 mcg/hr (10/02/15 16100722)   Time spent: 20 minutes   Debbora PrestoMAGICK-Candia Kingsbury, MD [facility] Pager [phone]  If 7PM-7AM, please contact night-coverage [email or web address] Password [email or web address] 10/02/2015, 1:07 PM

## 2015-10-02 NOTE — Consult Note (Signed)
[facility] NOTE   MRN : [id number]  Reason for Consult: malfunctioning right forarm graft, needs HD catheter Referring Physician: Dr. Hyman Hopes  History of Present Illness: 35 y/o male with chronic ESRD.  His most recent right forearm AV graft has had problems including right arm swelling and pain on HD.  The right AV graft was placed in [facility] January 2017.  Per report he has a right central vein occlusion but no records are available regarding this.   He has had previous left forearm AV fistula, and left upper arm AV fistula/graft attempts.  He has also had multiple HD catheters.  We are consulted for HD catheter placement.  Past medical history includes: HIV well managed, hypercholesterolemia managed with Lipitor  , and  GERD.  He was admitted this visit for  Hematemesis with a history of grade 1 esophogeal varicies and nonbleeding gastric ulcer.       Current Facility-Administered Medications  Medication Dose Route Frequency Provider Last Rate Last Dose  . 0.9 %  sodium chloride infusion  100 mL Intravenous PRN Weston Settle, PA-C      . 0.9 %  sodium chloride infusion  100 mL Intravenous PRN Delano Metz, MD      . acetaminophen (TYLENOL) tablet 650 mg  650 mg Oral Q6H PRN Alberteen Sam, MD   650 mg at 10/02/15 0865   Or  . acetaminophen (TYLENOL) suppository 650 mg  650 mg Rectal Q6H PRN Alberteen Sam, MD      . alteplase (CATHFLO ACTIVASE) injection 2 mg  2 mg Intracatheter Once PRN Weston Settle, PA-C      . busPIRone (BUSPAR) tablet 10 mg  10 mg Oral BID Alberteen Sam, MD   10 mg at 10/01/15 2302  . cefTRIAXone (ROCEPHIN) 1 g in dextrose 5 % 50 mL IVPB  1 g Intravenous Q24H Alberteen Sam, MD   1 g at 10/01/15 2302  . cinacalcet (SENSIPAR) tablet 120 mg  120 mg Oral Q breakfast Lauren D Bajbus, RPH   120 mg at 10/01/15 2301  . doxercalciferol (HECTOROL) injection 4 mcg  4 mcg Intravenous Q T,Th,Sa-HD Weston Settle,  PA-C      . efavirenz (SUSTIVA) tablet 600 mg  600 mg Oral QHS Alberteen Sam, MD   600 mg at 10/01/15 2301  . famotidine (PEPCID) tablet 20 mg  20 mg Oral Daily Alberteen Sam, MD   20 mg at 10/01/15 0654  . ferric gluconate (NULECIT) 62.5 mg in sodium chloride 0.9 % 100 mL IVPB  62.5 mg Intravenous Q Thu-HD Weston Settle, PA-C   62.5 mg at 10/01/15 1607  . heparin injection 1,000 Units  1,000 Units Dialysis PRN Weston Settle, PA-C      . lamiVUDine (EPIVIR) tablet 150 mg  150 mg Oral Once per day on Mon Thu Alberteen Sam, MD   150 mg at 10/01/15 2300  . levETIRAcetam (KEPPRA) tablet 500 mg  500 mg Oral BID Alberteen Sam, MD   500 mg at 10/01/15 2300  . lidocaine (LMX) 4 % cream   Topical Daily PRN Weston Settle, PA-C      . lidocaine (PF) (XYLOCAINE) 1 % injection 5 mL  5 mL Intradermal PRN Weston Settle, PA-C      . lidocaine-prilocaine (EMLA) cream 1 application  1 application Topical PRN Weston Settle, PA-C   1 application at 10/01/15 1601  . multivitamins with iron tablet 1 tablet  1 tablet Oral Daily Alberteen Sam, MD   1 tablet at 10/01/15 0953  . octreotide (SANDOSTATIN) 500 mcg in sodium chloride 0.9 % 250 mL (2 mcg/mL) infusion  50 mcg/hr Intravenous Continuous Melton Krebs, PA-C 25 mL/hr at 10/02/15 0722 50 mcg/hr at 10/02/15 0722  . ondansetron (ZOFRAN) tablet 4 mg  4 mg Oral Q6H PRN Alberteen Sam, MD       Or  . ondansetron (ZOFRAN) injection 4 mg  4 mg Intravenous Q6H PRN Alberteen Sam, MD      . oxyCODONE (Oxy IR/ROXICODONE) immediate release tablet 5 mg  5 mg Oral Q4H PRN Leda Gauze, NP   5 mg at 10/02/15 0219  . pantoprazole (PROTONIX) injection 40 mg  40 mg Intravenous Q12H Alberteen Sam, MD   40 mg at 10/01/15 2302  . pentafluoroprop-tetrafluoroeth (GEBAUERS) aerosol 1 application  1 application Topical PRN Weston Settle, PA-C      . sucroferric oxyhydroxide (VELPHORO) chewable tablet 500 mg   500 mg Oral TID WC Weston Settle, PA-C   500 mg at 10/01/15 1500  . tenofovir (VIREAD) tablet 300 mg  300 mg Oral Weekly Alberteen Sam, MD   300 mg at 10/01/15 2257    Pt meds include: Statin :Yes Betablocker: No ASA: No Other anticoagulants/antiplatelets: none  Past Medical History  Diagnosis Date  . Renal disorder   . Hypertension   . Immune deficiency disorder (HCC)   . HIV (human immunodeficiency virus infection) (HCC)   . GERD (gastroesophageal reflux disease)   . Migraines   . Parathyroid abnormality [facility])     Past Surgical History  Procedure Laterality Date  . Av fistula placement    . Esophagogastroduodenoscopy (egd) with propofol N/A 09/23/2015    Procedure: ESOPHAGOGASTRODUODENOSCOPY (EGD) WITH PROPOFOL;  Surgeon: Graylin Shiver, MD;  Location: [facility] ENDOSCOPY;  Service: Endoscopy;  Laterality: N/A;    Social History Social History  Substance Use Topics  . Smoking status: Current Every Day Smoker -- 0.50 packs/day for 20 years    Types: Cigarettes  . Smokeless tobacco: None  . Alcohol Use: No    Family History Family History  Problem Relation Age of Onset  . Hypertension Mother   . Diabetes Mother   . Hypertension Maternal Grandfather     No Known Allergies   REVIEW OF SYSTEMS  General: [ ]  Weight loss, [ ]  Fever, [ ]  chills Neurologic: [ ]  Dizziness, [ ]  Blackouts, [ ]  Seizure [ ]  Stroke, [ ]  "Mini stroke", [ ]  Slurred speech, [ ]  Temporary blindness; [ ]  weakness in arms or legs, [ ]  Hoarseness [ ]  Dysphagia Cardiac: [ ]  Chest pain/pressure, [ ]  Shortness of breath at rest [ ]  Shortness of breath with exertion, [ ]  Atrial fibrillation or irregular heartbeat  Vascular: [ ]  Pain in legs with walking, [ ]  Pain in legs at rest, [ ]  Pain in legs at night,  [ ]  Non-healing ulcer, [ ]  Blood clot in vein/DVT,   Pulmonary: [ ]  Home oxygen, [x ] Productive cough, [ ]  Coughing up blood, [ ]  Asthma,  [ ]  Wheezing [ ]  COPD Musculoskeletal:  [ ]  Arthritis, [  ] Low back pain, [ ]  Joint pain Hematologic: [ ]  Easy Bruising, [ ]  Anemia; [ ]  Hepatitis Gastrointestinal: [ ]  Blood in stool, [ ]  Gastroesophageal Reflux/heartburn, Urinary: [x ] chronic Kidney disease, [ ]  on HD - [ ]  MWF or [ x] TTHS, [ ]  Burning with  urination, [ ]  Difficulty urinating Skin: [ ]  Rashes, [x ] Wounds Psychological: [ ]  Anxiety, [ ]  Depression  Physical Examination Filed Vitals:   10/01/15 0623 10/01/15 1442 10/02/15 0214 10/02/15 0539  BP: 159/68 133/79 151/81 140/84  Pulse: 81 83 86 81  Temp: 98.6 F (37 C) 98.3 F (36.8 C) 98 F (36.7 C) 98 F (36.7 C)  TempSrc: Axillary Axillary Oral Axillary  Resp: 16 18 17 17   SpO2: 95% 95% 98% 100%   There is no weight on file to calculate BMI.  General:  WDWN in NAD Gait: Normal HENT: WNL Eyes: Pupils equal Pulmonary: normal non-labored breathing , without Rales, positive rhonchi,  wheezing Cardiac: RRR, without  Murmurs, rubs or gallops; No carotid bruits Abdomen: soft, NT, no masses Skin: no rashes, ulcers noted;  no Gangrene , no cellulitis; small amount of serous drainage from distal forearm incision that patient says has be present since December Vascular Exam/Pulses:Palpable thrill in graft left forearm, palpable radial and DP pulses bilaterally.   Musculoskeletal: no muscle wasting or atrophy; positive right whole arm firmness with chronic edema  Neurologic: A&O X 3; Appropriate Affect ;  SENSATION: normal; MOTOR FUNCTION: 5/5 Symmetric Speech is fluent/normal   Significant Diagnostic Studies: CBC Lab Results  Component Value Date   WBC 4.7 10/01/2015   HGB 11.2* 10/01/2015   HCT 33.3* 10/01/2015   MCV 90.5 10/01/2015   PLT 176 10/01/2015    BMET    Component Value Date/Time   NA 135 10/01/2015 0727   K 4.8 10/01/2015 0727   CL 91* 10/01/2015 0727   CO2 22 10/01/2015 0727   GLUCOSE 111* 10/01/2015 0727   BUN 78* 10/01/2015 0727   CREATININE 23.25* 10/01/2015 0727   CREATININE 12.15*  09/09/2015 0919   CALCIUM 8.5* 10/01/2015 0727   GFRNONAA 2* 10/01/2015 0727   GFRNONAA 5* 09/09/2015 0919   GFRAA 2* 10/01/2015 0727   GFRAA 5* 09/09/2015 0919   Estimated Creatinine Clearance: 4.2 mL/min (by C-G formula based on Cr of 23.25).  COAG Lab Results  Component Value Date   INR 1.09 09/22/2015     Non-Invasive Vascular Imaging:   ASSESSMENT/PLAN:  Right forearm Av graft malfunctioning. We will rest the graft for now Plan for HD catheter placement per Dr. Darrick PennaFields. He has eaten today.  We will plan catheter placement tomorrow morning.  NPO p MN.    Clinton GallantCOLLINS, EMMA [facility] 10/02/2015 10:04 AM  History and exam details as above.  Will rest right arm graft for now and have pt follow up as outpt. Regarding whether or not the graft can be used or ligated or new access.  Will place tunneled dialysis catheter in the morning.  Discussed with pt that he may require femoral catheter since he has multiple prior catheters before and known central vein obstruction.  Risks benefits complications including but not limited to bleeding infection catheter thrombosis and he wishes to proceed.  Offered to discuss situation with pts wife who was also in the room but she was busy with a phone call and declined.  Fabienne Brunsharles Fields, MD [facility] [place] Office: [phone] Pager: [phone]

## 2015-10-02 NOTE — Progress Notes (Signed)
[facility] ROUNDING NOTE   Subjective:   Interval History:  Unable to start his dialysis due to pain and oozing from arm  Objective:  Vital signs in last 24 hours:  Temp:  [98 F (36.7 C)-98.3 F (36.8 C)] 98 F (36.7 C) (05/12 0539) Pulse Rate:  [81-86] 81 (05/12 0539) Resp:  [17-18] 17 (05/12 0539) BP: (133-151)/(79-84) 140/84 mmHg (05/12 0539) SpO2:  [95 %-100 %] 100 % (05/12 0539)  Weight change:  There were no vitals filed for this visit.  Intake/Output: I/O last 3 completed shifts: In: 360 [P.O.:360] Out: -    Intake/Output this shift:  Total I/O In: 240 [P.O.:240] Out: -   General: Well developed, well nourished, in no acute distress on room air Head: Normocephalic, atraumatic, sclera non-icteric, mucus membranes are moist some facial puffiness Neck: Supple.  Lungs: Clear bilaterally to auscultation without wheezes, rales, or rhonchi. Breathing is unlabored. Heart: RRR with S1 S2. No murmurs, rubs, or gallops appreciated. Abdomen: Soft, non-tender, non-distended with normoactive bowel sounds. No rebound/guarding. No obvious abdominal masses. M-S: Strength and tone appear normal for age. Lower extremities:without edema or ischemic changes, no open wounds  Neuro: Alert and oriented X 3. Moves all extremities spontaneously. Dialysis Access: AVGG forearm - chronic swelling- per pt - + bruit  Basic Metabolic Panel:  Recent Labs Lab 09/29/15 1230 09/30/15 1718 10/01/15 0727  NA 140 138 135  K 4.2 3.9 4.8  CL 93* 93* 91*  CO2 25 23 22   GLUCOSE 89 74 111*  BUN 65* 74* 78*  CREATININE 18.96* 21.76* 23.25*  CALCIUM 8.6* 9.1 8.5*    Liver Function Tests:  Recent Labs Lab 09/29/15 1230 09/30/15 1718  AST 23 21  ALT 14* 14*  ALKPHOS 235* 229*  BILITOT 0.8 0.8  PROT 8.3* 7.7  ALBUMIN 3.8 3.6   No results for input(s): LIPASE, AMYLASE in the last 168 hours. No results for input(s): AMMONIA in the last 168 hours.  CBC:  Recent  Labs Lab 09/29/15 1230 09/30/15 1718 10/01/15 0727  WBC 6.6 6.5 4.7  NEUTROABS 4.5  --   --   HGB 11.7* 10.6* 11.2*  HCT 35.4* 32.4* 33.3*  MCV 89.6 88.3 90.5  PLT 177 159 176    Cardiac Enzymes: No results for input(s): CKTOTAL, CKMB, CKMBINDEX, TROPONINI in the last 168 hours.  BNP: Invalid input(s): POCBNP  CBG: No results for input(s): GLUCAP in the last 168 hours.  Microbiology: Results for orders placed or performed during the hospital encounter of 09/22/15  MRSA PCR Screening     Status: None   Collection Time: 09/23/15  2:30 AM  Result Value Ref Range Status   MRSA by PCR NEGATIVE NEGATIVE Final    Comment:        The GeneXpert MRSA Assay (FDA approved for NASAL specimens only), is one component of a comprehensive MRSA colonization surveillance program. It is not intended to diagnose MRSA infection nor to guide or monitor treatment for MRSA infections.     Coagulation Studies: No results for input(s): LABPROT, INR in the last 72 hours.  Urinalysis: No results for input(s): COLORURINE, LABSPEC, PHURINE, GLUCOSEU, HGBUR, BILIRUBINUR, KETONESUR, PROTEINUR, UROBILINOGEN, NITRITE, LEUKOCYTESUR in the last 72 hours.  Invalid input(s): APPERANCEUR    Imaging: X-ray Chest Pa And Lateral  09/30/2015  CLINICAL DATA:  35 year old male with hemoptysis EXAM: CHEST  2 VIEW COMPARISON:  Chest radiograph dated 09/22/2015 FINDINGS: Two views of the chest demonstrate clear lungs. There is no pleural  effusion or pneumothorax. Stable left hilar and lingular calcified granuloma. The cardiac silhouette is within normal limits. No acute osseous pathology. IMPRESSION: No active cardiopulmonary disease. Electronically Signed   By: Elgie Collard M.D.   On: 09/30/2015 23:05     Medications:   . octreotide  (SANDOSTATIN)    IV infusion 50 mcg/hr (10/02/15 0722)   . busPIRone  10 mg Oral BID  . cefTRIAXone (ROCEPHIN)  IV  1 g Intravenous Q24H  . cinacalcet  120 mg Oral Q  breakfast  . doxercalciferol  4 mcg Intravenous Q T,Th,Sa-HD  . efavirenz  600 mg Oral QHS  . famotidine  20 mg Oral Daily  . ferric gluconate (FERRLECIT/NULECIT) IV  62.5 mg Intravenous Q Thu-HD  . lamiVUDine  150 mg Oral Once per day on Mon Thu  . levETIRAcetam  500 mg Oral BID  . multivitamins with iron  1 tablet Oral Daily  . pantoprazole (PROTONIX) IV  40 mg Intravenous Q12H  . sucroferric oxyhydroxide  500 mg Oral TID WC  . tenofovir  300 mg Oral Weekly   sodium chloride, sodium chloride, acetaminophen **OR** acetaminophen, alteplase, heparin, lidocaine, lidocaine (PF), lidocaine-prilocaine, ondansetron **OR** ondansetron (ZOFRAN) IV, oxyCODONE, pentafluoroprop-tetrafluoroeth  Assessment/ Plan:  Robert Bradshaw is a 35 y.o. male with ESRD secondary to HTN on TTS HD at [facility] with PMHx for cocaine and marijuana abuse, HIV, HTN, GERD who was just admitted 5/2 for hematemesis, had an EGD 5/3 that showed grade 1 esophogeal varicies and nonbleeding gastric ulcer and then left AMA. 5/4 without any Rx..  1. Recurrent hematemesis - usually associated with coughing - no significant change in hgb; GI following on PPI, H2 blocker and octreotide drip   Hb 11.2  2. ESRD - TTS - ^^ BUN/Cr due to lack of dialysis ; planned for serial HD to lower numbers however patient is complaining of pain swelling in Right forearm graft - It appears this is a chronic issue and the pain and discomfort is leading to non compliance with his dialysis treatments. The access certainly is edematous with oozing of serosanguinous fluid from puncture site. -- I would recommend a Vascular opinion and place a diateck catheter for dialysis   3. Anemia - montior hgb weekly Fe ; no ESA needed yet 4. Metabolic bone disease - cotninue current meds, hectorol -iPTH 2400 - uncontrolled;- on aurixya as outpt - change to velphoro here  Ca 9.1  Alb 3.6  5. Nutrition- changed to regular diet per wife's request 6. HIV- meds per primary 7. Hx  of polysubstance abuse 8. Hx of dialysis noncompliance secondary to swelling and pain over graft 9. Access- hx of right sub occlusion 08/2015 venogram -- not convinced that this appears as though there is a reaction to gortex material  -- will check with [facility]   LOS:  Robert Bradshaw W  :25 AM

## 2015-10-02 NOTE — Progress Notes (Signed)
[facility] Progress Note  Subjective: No further signs of hemoptysis or hematemesis  Objective: Vital signs in last 24 hours: Temp:  [98 F (36.7 C)-98.3 F (36.8 C)] 98 F (36.7 C) (05/12 0539) Pulse Rate:  [81-86] 81 (05/12 0539) Resp:  [17-18] 17 (05/12 0539) BP: (133-151)/(79-84) 140/84 mmHg (05/12 0539) SpO2:  [95 %-100 %] 100 % (05/12 0539) Weight change:    PE:  No distress  Abdomen soft nontender  Lab Results: No results found for this or any previous visit (from the past 24 hour(s)).  Studies/Results: No results found.    Assessment: Hemoptysis and hematemesis  Plan:   Continue conservative medical management for now. No further GI intervention planned at this time. We will sign off. Call us if needed.    Gwenevere AbbotSAM F Dereona Kolodny 10/02/2015, 11:31 AM  Pager: [phone] If no answer or after 5 PM call [phone] Lab Results  Component Value Date   HGB 11.2* 10/01/2015   HGB 10.6* 09/30/2015   HGB 11.7* 09/29/2015   HCT 33.3* 10/01/2015   HCT 32.4* 09/30/2015   HCT 35.4* 09/29/2015   ALKPHOS 229* 09/30/2015   ALKPHOS 235* 09/29/2015   ALKPHOS 241* 09/23/2015   AST 21 09/30/2015   AST 23 09/29/2015   AST 18 09/23/2015   ALT 14* 09/30/2015   ALT 14* 09/29/2015   ALT 15* 09/23/2015

## 2015-10-02 NOTE — Progress Notes (Signed)
Late entry.  Patient's right arm AVG assessed, forearm noted to have significant edema from wrist to shoulder with erythema and distal surgical wound noted to have purulent drainage under band aid.  Patient states " arm hurts, especially when cannulated and continues to hurt 3 hours after needles are pulled.  My fingers are numb from time to time." Paged Dr. Hyman HopesWebb for further evaluation of patient's graft site.  Dr. Hyman HopesWebb at bedside and verbals orders received to return patient to room at this time.  Patient was not cannulated and did not receive hemodialysis treatment at this time.  6N nurse CJ Maisie Fushomas called and notified.

## 2015-10-02 NOTE — Progress Notes (Signed)
Patient arrived to the unit via transport team at 0052, was able to successfully cannulate both sites, while preparing the arterial site with paper tape patient stated that it hurt and would not allow any adjustment to secure the line. Patient stated he wanted to stop process and resume in the am. Mollie Germanyontacted Nancy the floor nurse with report.

## 2015-10-02 NOTE — Progress Notes (Signed)
Blood in stool noted this afternoon by patient, visualized by CNA, RN notified, MD informed.  Raspberry color identified, not bright red.

## 2015-10-03 ENCOUNTER — Inpatient Hospital Stay (HOSPITAL_COMMUNITY): Payer: Medicare Other | Admitting: Certified Registered Nurse Anesthetist

## 2015-10-03 ENCOUNTER — Encounter (HOSPITAL_COMMUNITY): Payer: Self-pay | Admitting: Certified Registered Nurse Anesthetist

## 2015-10-03 ENCOUNTER — Inpatient Hospital Stay (HOSPITAL_COMMUNITY): Payer: Medicare Other

## 2015-10-03 ENCOUNTER — Encounter (HOSPITAL_COMMUNITY)
Admission: EM | Disposition: A | Discharge status: Home / Self Care | Payer: Self-pay | Source: Home / Self Care | Attending: Internal Medicine

## 2015-10-03 DIAGNOSIS — Z452 Encounter for adjustment and management of vascular access device: Secondary | ICD-10-CM | POA: Insufficient documentation

## 2015-10-03 HISTORY — PX: INSERTION OF DIALYSIS CATHETER: SHX1324

## 2015-10-03 LAB — CBC
HCT: 31 % — ABNORMAL LOW (ref 39.0–52.0)
Hemoglobin: 10.5 g/dL — ABNORMAL LOW (ref 13.0–17.0)
MCH: 30.3 pg (ref 26.0–34.0)
MCHC: 33.9 g/dL (ref 30.0–36.0)
MCV: 89.6 fL (ref 78.0–100.0)
PLATELETS: 161 10*3/uL (ref 150–400)
RBC: 3.46 MIL/uL — AB (ref 4.22–5.81)
RDW: 16.6 % — ABNORMAL HIGH (ref 11.5–15.5)
WBC: 4.8 10*3/uL (ref 4.0–10.5)

## 2015-10-03 LAB — BASIC METABOLIC PANEL
Anion gap: 25 — ABNORMAL HIGH (ref 5–15)
BUN: 96 mg/dL — ABNORMAL HIGH (ref 6–20)
CO2: 18 mmol/L — ABNORMAL LOW (ref 22–32)
Calcium: 8.1 mg/dL — ABNORMAL LOW (ref 8.9–10.3)
Chloride: 97 mmol/L — ABNORMAL LOW (ref 101–111)
Creatinine, Ser: 27.81 mg/dL — ABNORMAL HIGH (ref 0.61–1.24)
GFR calc Af Amer: 2 mL/min — ABNORMAL LOW (ref >60)
GFR calc non Af Amer: 2 mL/min — ABNORMAL LOW (ref >60)
Glucose, Bld: 77 mg/dL (ref 65–99)
Potassium: 4.3 mmol/L (ref 3.5–5.1)
Sodium: 140 mmol/L (ref 135–145)

## 2015-10-03 SURGERY — INSERTION OF DIALYSIS CATHETER
Anesthesia: General

## 2015-10-03 MED ORDER — OXYCODONE HCL 5 MG PO TABS
ORAL_TABLET | ORAL | Status: AC
  Administered 2015-10-03: 5 mg via ORAL
  Filled 2015-10-03: qty 1

## 2015-10-03 MED ORDER — PHENYLEPHRINE HCL 10 MG/ML IJ SOLN
INTRAMUSCULAR | Status: DC | PRN
  Administered 2015-10-03: 360 ug via INTRAVENOUS
  Administered 2015-10-03: 120 ug via INTRAVENOUS

## 2015-10-03 MED ORDER — MIDAZOLAM HCL 2 MG/2ML IJ SOLN
INTRAMUSCULAR | Status: AC
  Filled 2015-10-03: qty 2

## 2015-10-03 MED ORDER — PROPOFOL 10 MG/ML IV BOLUS
INTRAVENOUS | Status: DC | PRN
  Administered 2015-10-03: 200 mg via INTRAVENOUS

## 2015-10-03 MED ORDER — EPHEDRINE SULFATE 50 MG/ML IJ SOLN
INTRAMUSCULAR | Status: DC | PRN
  Administered 2015-10-03: 10 mg via INTRAVENOUS

## 2015-10-03 MED ORDER — LIDOCAINE HCL (CARDIAC) 20 MG/ML IV SOLN
INTRAVENOUS | Status: DC | PRN
  Administered 2015-10-03: 50 mg via INTRAVENOUS
  Administered 2015-10-03: 50 mg via INTRATRACHEAL

## 2015-10-03 MED ORDER — HYDROMORPHONE HCL 1 MG/ML IJ SOLN: 0.5000 mg | INTRAMUSCULAR | Status: DC | PRN

## 2015-10-03 MED ORDER — DEXTROSE 5 % IV SOLN
10.0000 mg | INTRAVENOUS | Status: DC | PRN
  Administered 2015-10-03: 35 ug/min via INTRAVENOUS

## 2015-10-03 MED ORDER — IOPAMIDOL (ISOVUE-300) INJECTION 61%
INTRAVENOUS | Status: AC
  Filled 2015-10-03: qty 50

## 2015-10-03 MED ORDER — ACETAMINOPHEN 325 MG PO TABS
ORAL_TABLET | ORAL | Status: AC
  Administered 2015-10-03: 650 mg via ORAL
  Filled 2015-10-03: qty 2

## 2015-10-03 MED ORDER — DIPHENHYDRAMINE HCL 25 MG PO CAPS
25.0000 mg | ORAL_CAPSULE | Freq: Four times a day (QID) | ORAL | Status: DC | PRN
  Administered 2015-10-03 – 2015-10-04 (×3): 25 mg via ORAL
  Filled 2015-10-03 (×3): qty 1

## 2015-10-03 MED ORDER — CEFAZOLIN SODIUM-DEXTROSE 2-3 GM-% IV SOLR
INTRAVENOUS | Status: DC | PRN
  Administered 2015-10-03: 2 g via INTRAVENOUS

## 2015-10-03 MED ORDER — HEPARIN SODIUM (PORCINE) 1000 UNIT/ML IJ SOLN
INTRAMUSCULAR | Status: AC
  Filled 2015-10-03: qty 1

## 2015-10-03 MED ORDER — ONDANSETRON HCL 4 MG/2ML IJ SOLN
INTRAMUSCULAR | Status: DC | PRN
Start: 2015-10-03 — End: 2015-10-03
  Administered 2015-10-03 (×2): 4 mg via INTRAVENOUS

## 2015-10-03 MED ORDER — FENTANYL CITRATE (PF) 250 MCG/5ML IJ SOLN
INTRAMUSCULAR | Status: AC
  Filled 2015-10-03: qty 5

## 2015-10-03 MED ORDER — HYDRALAZINE HCL 20 MG/ML IJ SOLN
5.0000 mg | INTRAMUSCULAR | Status: DC | PRN
  Administered 2015-10-05: 5 mg via INTRAVENOUS

## 2015-10-03 MED ORDER — DIPHENHYDRAMINE HCL 25 MG PO CAPS
ORAL_CAPSULE | ORAL | Status: AC
  Administered 2015-10-03: 25 mg via ORAL
  Filled 2015-10-03: qty 1

## 2015-10-03 MED ORDER — DOXERCALCIFEROL 4 MCG/2ML IV SOLN
INTRAVENOUS | Status: AC
  Administered 2015-10-03: 4 ug via INTRAVENOUS
  Filled 2015-10-03: qty 2

## 2015-10-03 MED ORDER — SUCCINYLCHOLINE CHLORIDE 20 MG/ML IJ SOLN
INTRAMUSCULAR | Status: DC | PRN
  Administered 2015-10-03: 80 mg via INTRAVENOUS

## 2015-10-03 MED ORDER — SODIUM CHLORIDE 0.9 % IV SOLN
INTRAVENOUS | Status: DC | PRN
  Administered 2015-10-03: 500 mL

## 2015-10-03 MED ORDER — HEPARIN SODIUM (PORCINE) 1000 UNIT/ML IJ SOLN
INTRAMUSCULAR | Status: DC | PRN
  Administered 2015-10-03: 6 mL

## 2015-10-03 MED ORDER — LIDOCAINE HCL (PF) 1 % IJ SOLN
INTRAMUSCULAR | Status: AC
Start: 2015-10-03 — End: 2015-10-03
  Filled 2015-10-03: qty 30

## 2015-10-03 MED ORDER — ONDANSETRON HCL 4 MG/2ML IJ SOLN: 4.0000 mg | Freq: Once | INTRAMUSCULAR | Status: DC | PRN

## 2015-10-03 MED ORDER — FENTANYL CITRATE (PF) 100 MCG/2ML IJ SOLN
INTRAMUSCULAR | Status: DC | PRN
  Administered 2015-10-03: 25 ug via INTRAVENOUS
  Administered 2015-10-03: 50 ug via INTRAVENOUS
  Administered 2015-10-03: 25 ug via INTRAVENOUS

## 2015-10-03 MED ORDER — 0.9 % SODIUM CHLORIDE (POUR BTL) OPTIME
TOPICAL | Status: DC | PRN
  Administered 2015-10-03: 1000 mL

## 2015-10-03 MED ORDER — SODIUM CHLORIDE 0.9 % IV SOLN
INTRAVENOUS | Status: DC
  Administered 2015-10-05: 16:00:00 via INTRAVENOUS
  Administered 2015-10-05: 35 mL/h via INTRAVENOUS
  Administered 2015-10-07: 09:00:00 via INTRAVENOUS

## 2015-10-03 SURGICAL SUPPLY — 36 items
BAG DECANTER FOR FLEXI CONT (MISCELLANEOUS) ×2 IMPLANT
BIOPATCH BLUE 3/4IN DISK W/1.5 (GAUZE/BANDAGES/DRESSINGS) ×2 IMPLANT
BIOPATCH RED 1 DISK 7.0 (GAUZE/BANDAGES/DRESSINGS) ×2 IMPLANT
CATH PALINDROME RT-P 15FX19CM (CATHETERS) IMPLANT
CATH PALINDROME RT-P 15FX23CM (CATHETERS) IMPLANT
CATH PALINDROME RT-P 15FX28CM (CATHETERS) IMPLANT
CATH PALINDROME RT-P 15FX55CM (CATHETERS) ×2 IMPLANT
CATH STRAIGHT 5FR 65CM (CATHETERS) IMPLANT
CHLORAPREP W/TINT 26ML (MISCELLANEOUS) ×2 IMPLANT
COVER PROBE W GEL 5X96 (DRAPES) ×4 IMPLANT
DECANTER SPIKE VIAL GLASS SM (MISCELLANEOUS) IMPLANT
DRAPE C-ARM 42X72 X-RAY (DRAPES) ×2 IMPLANT
DRAPE CHEST BREAST 15X10 FENES (DRAPES) ×4 IMPLANT
GAUZE SPONGE 2X2 8PLY STRL LF (GAUZE/BANDAGES/DRESSINGS) ×1 IMPLANT
GAUZE SPONGE 4X4 16PLY XRAY LF (GAUZE/BANDAGES/DRESSINGS) ×2 IMPLANT
GLOVE BIO SURGEON STRL SZ7.5 (GLOVE) ×2 IMPLANT
GOWN STRL REUS W/ TWL LRG LVL3 (GOWN DISPOSABLE) ×2 IMPLANT
GOWN STRL REUS W/TWL LRG LVL3 (GOWN DISPOSABLE) ×2
KIT BASIN OR (CUSTOM PROCEDURE TRAY) ×2 IMPLANT
KIT ROOM TURNOVER OR (KITS) ×2 IMPLANT
NEEDLE 18GX1X1/2 (RX/OR ONLY) (NEEDLE) ×2 IMPLANT
NEEDLE HYPO 25GX1X1/2 BEV (NEEDLE) ×2 IMPLANT
NS IRRIG 1000ML POUR BTL (IV SOLUTION) ×2 IMPLANT
PACK SURGICAL SETUP 50X90 (CUSTOM PROCEDURE TRAY) ×2 IMPLANT
PAD ARMBOARD 7.5X6 YLW CONV (MISCELLANEOUS) ×4 IMPLANT
SET MICROPUNCTURE 5F STIFF (MISCELLANEOUS) IMPLANT
SPONGE GAUZE 2X2 STER 10/PKG (GAUZE/BANDAGES/DRESSINGS) ×1
SUT ETHILON 3 0 PS 1 (SUTURE) ×2 IMPLANT
SUT VICRYL 4-0 PS2 18IN ABS (SUTURE) ×2 IMPLANT
SYR 20CC LL (SYRINGE) ×4 IMPLANT
SYR 5ML LL (SYRINGE) ×2 IMPLANT
SYR CONTROL 10ML LL (SYRINGE) ×2 IMPLANT
SYRINGE 10CC LL (SYRINGE) ×2 IMPLANT
TAPE CLOTH SURG 4X10 WHT LF (GAUZE/BANDAGES/DRESSINGS) ×2 IMPLANT
WATER STERILE IRR 1000ML POUR (IV SOLUTION) ×2 IMPLANT
WIRE AMPLATZ SS-J .035X180CM (WIRE) IMPLANT

## 2015-10-03 NOTE — Anesthesia Preprocedure Evaluation (Signed)
Anesthesia Evaluation  Patient identified by MRN, date of birth, ID band Patient awake    Reviewed: Allergy & Precautions, NPO status , Patient's Chart, lab work & pertinent test results  Airway Mallampati: II  TM Distance: >3 FB Neck ROM: Full    Dental   Pulmonary Current Smoker,    Pulmonary exam normal        Cardiovascular hypertension, Normal cardiovascular exam     Neuro/Psych  Headaches, Seizures -,     GI/Hepatic GERD  ,  Endo/Other    Renal/GU Dialysis, ESRF and CRFRenal disease     Musculoskeletal   Abdominal   Peds  Hematology  (+) HIV,   Anesthesia Other Findings   Reproductive/Obstetrics                             Anesthesia Physical Anesthesia Plan  ASA: III  Anesthesia Plan: General and MAC   Post-op Pain Management:    Induction: Intravenous  Airway Management Planned: Oral ETT  Additional Equipment:   Intra-op Plan:   Post-operative Plan: Extubation in OR  Informed Consent: I have reviewed the patients History and Physical, chart, labs and discussed the procedure including the risks, benefits and alternatives for the proposed anesthesia with the patient or authorized representative who has indicated his/her understanding and acceptance.     Plan Discussed with: CRNA, Anesthesiologist and Surgeon  Anesthesia Plan Comments:         Anesthesia Quick Evaluation

## 2015-10-03 NOTE — Progress Notes (Signed)
Chest xray reviewed cath tip in atrium.  Will sign off.  Outpt follow up in 1 month  Fabienne Brunsharles Alazae Crymes, MD [facility] [place] Office: [phone] Pager: [phone]

## 2015-10-03 NOTE — Progress Notes (Signed)
[facility]:  [facility]  Today/ ready to eat Lunch first.  Objective Vital signs in last 24 hours: Filed Vitals:   10/02/15 2126 10/03/15 0900 10/03/15 0915 10/03/15 0938  BP: 159/89 144/57 151/70 156/71  Pulse: 83 79 80 79  Temp: 97.9 F (36.6 C) 98 F (36.7 C) 98.3 F (36.8 C) 98.1 F (36.7 C)  TempSrc: Oral   Oral  Resp: 18 13 12 14   SpO2: 100% 98% 100% 100%   Weight change:   Physical Exam: General: alert,AAM, NAD, facial edema noted /ust walked back from bathroom to bed Heart: RRR, no mur , rub or gallop Lungs: CTA bilat / nonlabored breathing Abdomen: soft , NT, ND Extremities: Swelling R arm / no pedal edema Dialysis Access: R Fem Perm cath with mild bloody  oozing on dressing  (pt was just up waling to BR)/ pos . Bruit R FA AVG with swelling bandaged proximal site clean /dry   [facility] 77.5kg 2/k 2.25 ca  bath RFA AVG Hep 8000 Hect 4 ug/Venofer 50/wk  Last op lab s= HGB 12.1 5/04  Ca 8.2 phos 7.7 pth 2419< 2473   Problem/Plan: 1. Recurrent hematemesis - HGB sable at 10.5< 11.2 ,10.6 /usually associated with coughing - no significant change in hgb; GI following on PPI, H2 blocker and octreotide drip 2. ESRD - TTS - [facility], k 4.3,  97 bun, cr^= 28.7 ^ BUN/Cr due to lack of dialysis ; planned for serial HD to lower numbers however patient is complaining of pain swelling in Right forearm graft -Dr. Darrick PennaFields eval /  today has placed Fem perm cath/ / rest AVGG  And  Fu in VVS ov  27month. ??   3. Anemiaof ESRD and GI Bld  - montior hgb weekly Fe ; no ESA needed yet 4. Metabolic bone disease - cotninue current meds, hectorol -iPTH 2400 - uncontrolled;- on aurixya as outpt - change to velphoro here Ca 9.1 Alb 3.6  5. Nutrition- changed to regular diet per wife's request in Past he lest AMA  Because he had RENAL diet 6. HIV- meds per primary 7. Hx of polysubstance abuse 8. Hx of dialysis noncompliance secondary to  swelling and pain over graft/Access- hx of right sub occlusion 08/2015 venogram -- not convinced that this appears as though there is a reaction to gortex material --   Robert Pastelavid Evalyse Stroope, PA-C [facility] Beeper [phone] 10/03/2015,12:39 PM  LOS: 1 day   Labs: Basic Metabolic Panel:  Recent Labs Lab 09/30/15 1718 10/01/15 0727 10/03/15 0541  NA 138 135 140  K 3.9 4.8 4.3  CL 93* 91* 97*  CO2 23 22 18*  GLUCOSE 74 111* 77  BUN 74* 78* 96*  CREATININE 21.76* 23.25* 27.81*  CALCIUM 9.1 8.5* 8.1*   Liver Function Tests:  Recent Labs Lab 09/29/15 1230 09/30/15 1718  AST 23 21  ALT 14* 14*  ALKPHOS 235* 229*  BILITOT 0.8 0.8  PROT 8.3* 7.7  ALBUMIN 3.8 3.6   No results for input(s): LIPASE, AMYLASE in the last 168 hours. No results for input(s): AMMONIA in the last 168 hours. CBC:  Recent Labs Lab 09/29/15 1230 09/30/15 1718 10/01/15 0727 10/03/15 0541  WBC 6.6 6.5 4.7 4.8  NEUTROABS 4.5  --   --   --   HGB 11.7* 10.6* 11.2* 10.5*  HCT 35.4* 32.4* 33.3* 31.0*  MCV 89.6 88.3 90.5 89.6  PLT 177 159 176 161  Cardiac Enzymes: No results for input(s): CKTOTAL, CKMB, CKMBINDEX, TROPONINI in the last 168 hours. CBG: No results for input(s): GLUCAP in the last 168 hours.  Studies/Results: Dg Chest Port 1 View  10/03/2015  CLINICAL DATA:  Status post femoral dialysis catheter placement EXAM: PORTABLE CHEST 1 VIEW COMPARISON:  Sep 30, 2015 FINDINGS: The right dialysis catheter terminates either within the right atrium or within the central SVC. The inferior caval atrial junction is 9 cm proximal to the distal tip. No pneumothorax. Mild pulmonary edema. No other acute interval changes. IMPRESSION: The right femoral catheter terminates either within the right atrium or the central SVC. Consider withdrawing several cm. The caval atrial junction is 9 cm proximal to the tip. Mild edema. These results will be called to the ordering clinician or representative by the  Radiologist Assistant, and communication documented in the PACS or zVision Dashboard. Electronically Signed   By: Gerome Sam III M.D   On: 10/03/2015 09:29   Dg Horace Porteous Guide Cv Line-no Report  10/03/2015  CLINICAL DATA:  FLOURO GUIDE CV LINE Fluoroscopy was utilized by the requesting physician.  No radiographic interpretation.   Medications: . sodium chloride    . octreotide  (SANDOSTATIN)    IV infusion 50 mcg/hr (10/03/15 0904)   . busPIRone  10 mg Oral BID  . cefTRIAXone (ROCEPHIN)  IV  1 g Intravenous Q24H  . cinacalcet  120 mg Oral Q breakfast  . doxercalciferol  4 mcg Intravenous Q T,Th,Sa-HD  . efavirenz  600 mg Oral QHS  . ferric gluconate (FERRLECIT/NULECIT) IV  62.5 mg Intravenous Q Thu-HD  . lamiVUDine  150 mg Oral Once per day on Mon Thu  . levETIRAcetam  500 mg Oral BID  . multivitamins with iron  1 tablet Oral Daily  . pantoprazole (PROTONIX) IV  40 mg Intravenous Q12H  . tenofovir  300 mg Oral Weekly

## 2015-10-03 NOTE — Progress Notes (Signed)
Patient ID: Robert BernheimJoe Clingenpeel, male   DOB: 04/05/1981, 35 y.o.   MRN: [id number]   PROGRESS NOTE    Robert BernheimJoe Bradshaw  WUJ:[id number] DOB: 01/12/1981 DOA: 09/30/2015  PCP: Jeanine Luzalone, Gregory, FNP   Outpatient Specialists:   Brief Narrative:  35 y.o. male with a past medical history significant for ESRD on HD TThS, HIV well controlled who presented with hematemesis, last EGD April 2017 with grade I varices and non bleeding ulcer. He missed last HD on Tuesday.   Assessment & Plan:  1. Hematemesis in setting of known ulcer and grade I varices - no further episodes - Hg slightly down in the past 24 hours  - no indication for endoscopy at this time per GI team - continue PPI  2. HIV - HIV RNA undetect last month. Missed establishment of care appt with Dr. Daiva EvesVan Dam last week - Continue home antiretrovirals  3. HTN, essential  - off antihypertensives  - will add hydralazine as needed for SBP > 155   4. ESRD on HD TThS - per nephrology team - malfunctioning right forarm graft, needs HD catheter, vascular surgery consulted  - plan for insertion of HD cath today   5. Hx of seizures - Continue Keppra - no seizures here   6. Combination anemia of renal disease and acute blood loss anemia  - no indication for transfusion  - CBC In AM  7. Access- hx of right sub occlusion 08/2015 venogram  - vascular surgery consulted for assistance   DVT prophylaxis: SCD's Code Status: Full  Family Communication: Patient and wife at bedside  Disposition Plan: Home when cleared by nephrology team   Consultants:   Nephrology  GI  Procedures:   GI  Antimicrobials:  Rocephin 5/10 --> 5/12  Subjective: Says he is tired, wants to rest. Also asking to make sure he can have double portions of food.   Objective: Filed Vitals:   10/02/15 0214 10/02/15 0539 10/02/15 1828 10/02/15 2126  BP: 151/81 140/84 137/86 159/89  Pulse: 86 81 77 83  Temp: 98 F (36.7 C) 98 F (36.7 C) 97.6 F (36.4 C) 97.9 F (36.6  C)  TempSrc: Oral Axillary Oral Oral  Resp: 17 17 16 18   SpO2: 98% 100% 100% 100%    Intake/Output Summary (Last 24 hours) at 10/03/15 0846 Last data filed at 10/03/15 0845  Gross per 24 hour  Intake 15292.5 ml  Output     10 ml  Net 15282.5 ml   Examination:  General exam: Appears calm and comfortable  Respiratory system: Clear to auscultation. Respiratory effort normal. Cardiovascular system: S1 & S2 heard, RRR. No JVD, rubs, gallops or clicks. No pedal edema. Gastrointestinal system: Abdomen is nondistended, soft and nontender.  Dialysis Access: AVGG forearm - chronic swelling- per pt - + bruit  Data Reviewed: I have personally reviewed following labs and imaging studies  CBC:  Recent Labs Lab 09/29/15 1230 09/30/15 1718 10/01/15 0727 10/03/15 0541  WBC 6.6 6.5 4.7 4.8  NEUTROABS 4.5  --   --   --   HGB 11.7* 10.6* 11.2* 10.5*  HCT 35.4* 32.4* 33.3* 31.0*  MCV 89.6 88.3 90.5 89.6  PLT 177 159 176 161   Basic Metabolic Panel:  Recent Labs Lab 09/29/15 1230 09/30/15 1718 10/01/15 0727 10/03/15 0541  NA 140 138 135 140  K 4.2 3.9 4.8 4.3  CL 93* 93* 91* 97*  CO2 25 23 22  18*  GLUCOSE 89 74 111* 77  BUN 65* 74* 78*  96*  CREATININE 18.96* 21.76* 23.25* 27.81*  CALCIUM 8.6* 9.1 8.5* 8.1*   Liver Function Tests:  Recent Labs Lab 09/29/15 1230 09/30/15 1718  AST 23 21  ALT 14* 14*  ALKPHOS 235* 229*  BILITOT 0.8 0.8  PROT 8.3* 7.7  ALBUMIN 3.8 3.6   Recent Results (from the past 240 hour(s))  MRSA PCR Screening     Status: None   Collection Time: 09/23/15  2:30 AM  Result Value Ref Range Status   MRSA by PCR NEGATIVE NEGATIVE Final    Radiology Studies: X-ray Chest Pa And Lateral 09/30/2015  No active cardiopulmonary disease.    Scheduled Meds: . [MAR Hold] busPIRone  10 mg Oral BID  . [MAR Hold] cefTRIAXone (ROCEPHIN)  IV  1 g Intravenous Q24H  . [MAR Hold] cinacalcet  120 mg Oral Q breakfast  . [MAR Hold] doxercalciferol  4 mcg  Intravenous Q T,Th,Sa-HD  . [MAR Hold] efavirenz  600 mg Oral QHS  . [MAR Hold] ferric gluconate (FERRLECIT/NULECIT) IV  62.5 mg Intravenous Q Thu-HD  . [MAR Hold] lamiVUDine  150 mg Oral Once per day on Mon Thu  . [MAR Hold] levETIRAcetam  500 mg Oral BID  . [MAR Hold] multivitamins with iron  1 tablet Oral Daily  . [MAR Hold] pantoprazole (PROTONIX) IV  40 mg Intravenous Q12H  . [MAR Hold] tenofovir  300 mg Oral Weekly   Continuous Infusions: . sodium chloride    . octreotide  (SANDOSTATIN)    IV infusion 50 mcg/hr (10/02/15 1857)   Time spent: 20 minutes   Debbora Presto, MD [facility] Pager [phone]  If 7PM-7AM, please contact night-coverage [email or web address] Password [facility] 10/03/2015, 8:46 AM

## 2015-10-03 NOTE — Interval H&P Note (Signed)
History and Physical Interval Note:  10/03/2015 7:34 AM  Robert BernheimJoe Clayborn  has presented today for surgery, with the diagnosis of End Stage Renal Disease N18.6  The various methods of treatment have been discussed with the patient and family. After consideration of risks, benefits and other options for treatment, the patient has consented to  Procedure(s): INSERTION OF DIALYSIS CATHETER (N/A) as a surgical intervention .  The patient's history has been reviewed, patient examined, no change in status, stable for surgery.  I have reviewed the patient's chart and labs.  Questions were answered to the patient's satisfaction.     Fabienne BrunsFields, Natthew Marlatt

## 2015-10-03 NOTE — Progress Notes (Signed)
Patient arrived to unit by bed.  Reviewed treatment plan and this RN agrees with plan.  Report received from bedside RN, Robert Bradshaw.  Consent verified.  Patient A & o X 4.   Lung sounds clear to ausculation in all fields. Moderate edema. Cardiac:  NSR.  Removed caps and cleansed R thigh catheter with chlorhedxidine.  Aspirated ports of heparin and flushed them with saline per protocol.  Connected and secured lines, initiated treatment at 1830.  UF Goal of 3500mL and net fluid removal 3L.  Will continue to monitor.

## 2015-10-03 NOTE — Progress Notes (Signed)
Dialysis treatment completed.  2059 mL ultrafiltrated.  1559 mL net fluid removal.  Patient status unchanged. Lung sounds diminished to ausculation in all fields. Moderate edema. Cardiac:ST.  Cleansed R thigh catheter with chlorhexidine.  Disconnected lines and flushed ports with saline per protocol.  Ports locked with heparin and capped per protocol.    Report given to bedside, RN Dois DavenportSandra.

## 2015-10-03 NOTE — Anesthesia Postprocedure Evaluation (Signed)
Anesthesia Post Note  Patient: Robert Bradshaw  Procedure(s) Performed: Procedure(s) (LRB): INSERTION OF DIALYSIS CATHETER RIGHT FEMORAL VEIN (N/A)  Patient location during evaluation: PACU Anesthesia Type: General Level of consciousness: awake, oriented, sedated and patient cooperative Pain management: pain level controlled Vital Signs Assessment: post-procedure vital signs reviewed and stable Respiratory status: spontaneous breathing and respiratory function stable Cardiovascular status: stable Anesthetic complications: no    Last Vitals:  Filed Vitals:   10/03/15 0900 10/03/15 0915  BP: 144/57 151/70  Pulse: 79 80  Temp: 36.7 C   Resp: 13 6    Last Pain:  Filed Vitals:   10/03/15 0918  PainSc: 8                  Rubylee Zamarripa EDWARD

## 2015-10-03 NOTE — H&P (View-Only) (Signed)
[facility] NOTE   MRN : [id number]  Reason for Consult: malfunctioning right forarm graft, needs HD catheter Referring Physician: Dr. Hyman Hopes  History of Present Illness: 35 y/o male with chronic ESRD.  His most recent right forearm AV graft has had problems including right arm swelling and pain on HD.  The right AV graft was placed in [facility] January 2017.  Per report he has a right central vein occlusion but no records are available regarding this.   He has had previous left forearm AV fistula, and left upper arm AV fistula/graft attempts.  He has also had multiple HD catheters.  We are consulted for HD catheter placement.  Past medical history includes: HIV well managed, hypercholesterolemia managed with Lipitor  , and  GERD.  He was admitted this visit for  Hematemesis with a history of grade 1 esophogeal varicies and nonbleeding gastric ulcer.       Current Facility-Administered Medications  Medication Dose Route Frequency Provider Last Rate Last Dose  . 0.9 %  sodium chloride infusion  100 mL Intravenous PRN Weston Settle, PA-C      . 0.9 %  sodium chloride infusion  100 mL Intravenous PRN Delano Metz, MD      . acetaminophen (TYLENOL) tablet 650 mg  650 mg Oral Q6H PRN Alberteen Sam, MD   650 mg at 10/02/15 0865   Or  . acetaminophen (TYLENOL) suppository 650 mg  650 mg Rectal Q6H PRN Alberteen Sam, MD      . alteplase (CATHFLO ACTIVASE) injection 2 mg  2 mg Intracatheter Once PRN Weston Settle, PA-C      . busPIRone (BUSPAR) tablet 10 mg  10 mg Oral BID Alberteen Sam, MD   10 mg at 10/01/15 2302  . cefTRIAXone (ROCEPHIN) 1 g in dextrose 5 % 50 mL IVPB  1 g Intravenous Q24H Alberteen Sam, MD   1 g at 10/01/15 2302  . cinacalcet (SENSIPAR) tablet 120 mg  120 mg Oral Q breakfast Lauren D Bajbus, RPH   120 mg at 10/01/15 2301  . doxercalciferol (HECTOROL) injection 4 mcg  4 mcg Intravenous Q T,Th,Sa-HD Weston Settle,  PA-C      . efavirenz (SUSTIVA) tablet 600 mg  600 mg Oral QHS Alberteen Sam, MD   600 mg at 10/01/15 2301  . famotidine (PEPCID) tablet 20 mg  20 mg Oral Daily Alberteen Sam, MD   20 mg at 10/01/15 0654  . ferric gluconate (NULECIT) 62.5 mg in sodium chloride 0.9 % 100 mL IVPB  62.5 mg Intravenous Q Thu-HD Weston Settle, PA-C   62.5 mg at 10/01/15 1607  . heparin injection 1,000 Units  1,000 Units Dialysis PRN Weston Settle, PA-C      . lamiVUDine (EPIVIR) tablet 150 mg  150 mg Oral Once per day on Mon Thu Alberteen Sam, MD   150 mg at 10/01/15 2300  . levETIRAcetam (KEPPRA) tablet 500 mg  500 mg Oral BID Alberteen Sam, MD   500 mg at 10/01/15 2300  . lidocaine (LMX) 4 % cream   Topical Daily PRN Weston Settle, PA-C      . lidocaine (PF) (XYLOCAINE) 1 % injection 5 mL  5 mL Intradermal PRN Weston Settle, PA-C      . lidocaine-prilocaine (EMLA) cream 1 application  1 application Topical PRN Weston Settle, PA-C   1 application at 10/01/15 1601  . multivitamins with iron tablet 1 tablet  1 tablet Oral Daily Alberteen Sam, MD   1 tablet at 10/01/15 0953  . octreotide (SANDOSTATIN) 500 mcg in sodium chloride 0.9 % 250 mL (2 mcg/mL) infusion  50 mcg/hr Intravenous Continuous Melton Krebs, PA-C 25 mL/hr at 10/02/15 0722 50 mcg/hr at 10/02/15 0722  . ondansetron (ZOFRAN) tablet 4 mg  4 mg Oral Q6H PRN Alberteen Sam, MD       Or  . ondansetron (ZOFRAN) injection 4 mg  4 mg Intravenous Q6H PRN Alberteen Sam, MD      . oxyCODONE (Oxy IR/ROXICODONE) immediate release tablet 5 mg  5 mg Oral Q4H PRN Leda Gauze, NP   5 mg at 10/02/15 0219  . pantoprazole (PROTONIX) injection 40 mg  40 mg Intravenous Q12H Alberteen Sam, MD   40 mg at 10/01/15 2302  . pentafluoroprop-tetrafluoroeth (GEBAUERS) aerosol 1 application  1 application Topical PRN Weston Settle, PA-C      . sucroferric oxyhydroxide (VELPHORO) chewable tablet 500 mg   500 mg Oral TID WC Weston Settle, PA-C   500 mg at 10/01/15 1500  . tenofovir (VIREAD) tablet 300 mg  300 mg Oral Weekly Alberteen Sam, MD   300 mg at 10/01/15 2257    Pt meds include: Statin :Yes Betablocker: No ASA: No Other anticoagulants/antiplatelets: none  Past Medical History  Diagnosis Date  . Renal disorder   . Hypertension   . Immune deficiency disorder (HCC)   . HIV (human immunodeficiency virus infection) (HCC)   . GERD (gastroesophageal reflux disease)   . Migraines   . Parathyroid abnormality [facility])     Past Surgical History  Procedure Laterality Date  . Av fistula placement    . Esophagogastroduodenoscopy (egd) with propofol N/A 09/23/2015    Procedure: ESOPHAGOGASTRODUODENOSCOPY (EGD) WITH PROPOFOL;  Surgeon: Graylin Shiver, MD;  Location: [facility] ENDOSCOPY;  Service: Endoscopy;  Laterality: N/A;    Social History Social History  Substance Use Topics  . Smoking status: Current Every Day Smoker -- 0.50 packs/day for 20 years    Types: Cigarettes  . Smokeless tobacco: None  . Alcohol Use: No    Family History Family History  Problem Relation Age of Onset  . Hypertension Mother   . Diabetes Mother   . Hypertension Maternal Grandfather     No Known Allergies   REVIEW OF SYSTEMS  General: [ ]  Weight loss, [ ]  Fever, [ ]  chills Neurologic: [ ]  Dizziness, [ ]  Blackouts, [ ]  Seizure [ ]  Stroke, [ ]  "Mini stroke", [ ]  Slurred speech, [ ]  Temporary blindness; [ ]  weakness in arms or legs, [ ]  Hoarseness [ ]  Dysphagia Cardiac: [ ]  Chest pain/pressure, [ ]  Shortness of breath at rest [ ]  Shortness of breath with exertion, [ ]  Atrial fibrillation or irregular heartbeat  Vascular: [ ]  Pain in legs with walking, [ ]  Pain in legs at rest, [ ]  Pain in legs at night,  [ ]  Non-healing ulcer, [ ]  Blood clot in vein/DVT,   Pulmonary: [ ]  Home oxygen, [x ] Productive cough, [ ]  Coughing up blood, [ ]  Asthma,  [ ]  Wheezing [ ]  COPD Musculoskeletal:  [ ]  Arthritis, [  ] Low back pain, [ ]  Joint pain Hematologic: [ ]  Easy Bruising, [ ]  Anemia; [ ]  Hepatitis Gastrointestinal: [ ]  Blood in stool, [ ]  Gastroesophageal Reflux/heartburn, Urinary: [x ] chronic Kidney disease, [ ]  on HD - [ ]  MWF or [ x] TTHS, [ ]  Burning with  urination, [ ]  Difficulty urinating Skin: [ ]  Rashes, [x ] Wounds Psychological: [ ]  Anxiety, [ ]  Depression  Physical Examination Filed Vitals:   10/01/15 0623 10/01/15 1442 10/02/15 0214 10/02/15 0539  BP: 159/68 133/79 151/81 140/84  Pulse: 81 83 86 81  Temp: 98.6 F (37 C) 98.3 F (36.8 C) 98 F (36.7 C) 98 F (36.7 C)  TempSrc: Axillary Axillary Oral Axillary  Resp: 16 18 17 17   SpO2: 95% 95% 98% 100%   There is no weight on file to calculate BMI.  General:  WDWN in NAD Gait: Normal HENT: WNL Eyes: Pupils equal Pulmonary: normal non-labored breathing , without Rales, positive rhonchi,  wheezing Cardiac: RRR, without  Murmurs, rubs or gallops; No carotid bruits Abdomen: soft, NT, no masses Skin: no rashes, ulcers noted;  no Gangrene , no cellulitis; small amount of serous drainage from distal forearm incision that patient says has be present since December Vascular Exam/Pulses:Palpable thrill in graft left forearm, palpable radial and DP pulses bilaterally.   Musculoskeletal: no muscle wasting or atrophy; positive right whole arm firmness with chronic edema  Neurologic: A&O X 3; Appropriate Affect ;  SENSATION: normal; MOTOR FUNCTION: 5/5 Symmetric Speech is fluent/normal   Significant Diagnostic Studies: CBC Lab Results  Component Value Date   WBC 4.7 10/01/2015   HGB 11.2* 10/01/2015   HCT 33.3* 10/01/2015   MCV 90.5 10/01/2015   PLT 176 10/01/2015    BMET    Component Value Date/Time   NA 135 10/01/2015 0727   K 4.8 10/01/2015 0727   CL 91* 10/01/2015 0727   CO2 22 10/01/2015 0727   GLUCOSE 111* 10/01/2015 0727   BUN 78* 10/01/2015 0727   CREATININE 23.25* 10/01/2015 0727   CREATININE 12.15*  09/09/2015 0919   CALCIUM 8.5* 10/01/2015 0727   GFRNONAA 2* 10/01/2015 0727   GFRNONAA 5* 09/09/2015 0919   GFRAA 2* 10/01/2015 0727   GFRAA 5* 09/09/2015 0919   Estimated Creatinine Clearance: 4.2 mL/min (by C-G formula based on Cr of 23.25).  COAG Lab Results  Component Value Date   INR 1.09 09/22/2015     Non-Invasive Vascular Imaging:   ASSESSMENT/PLAN:  Right forearm Av graft malfunctioning. We will rest the graft for now Plan for HD catheter placement per Dr. Darrick PennaFields. He has eaten today.  We will plan catheter placement tomorrow morning.  NPO p MN.    Clinton GallantCOLLINS, EMMA [facility] 10/02/2015 10:04 AM  History and exam details as above.  Will rest right arm graft for now and have pt follow up as outpt. Regarding whether or not the graft can be used or ligated or new access.  Will place tunneled dialysis catheter in the morning.  Discussed with pt that he may require femoral catheter since he has multiple prior catheters before and known central vein obstruction.  Risks benefits complications including but not limited to bleeding infection catheter thrombosis and he wishes to proceed.  Offered to discuss situation with pts wife who was also in the room but she was busy with a phone call and declined.  Fabienne Brunsharles Stedman [place], MD [facility] [place] Office: [phone] Pager: [phone]

## 2015-10-03 NOTE — Op Note (Signed)
Procedure: Ultrasound-guided insertion of right femoral palindrome catheter  Preoperative diagnosis: End-stage renal disease  Postoperative diagnosis: Same  Anesthesia: General  Operative findings: 55 cm palindrome catheter right femoral vein  Operative details: After obtaining informed consent, the patient was taken to the operating room. The patient was placed in supine position on the operating room table. The patient's neck and chest were prepped and draped in usual sterile fashion.  US was used to inspect both sides of the neck.  The internal jugular veins were not visualized bilaterally.  The common carotid artery was identified with normal pulsatility.  I did not feel there was a suitable vein to try a jugular approach catheter.  Both groins were prepped and draped in usual sterile fashion. The patient was placed in reverse Trendelenburg position. Ultrasound was used to identify the patient's right femoral vein. This had normal compressibility and respiratory variation.  Using ultrasound guidance, the right femoral vein was successfully cannulated.  A 0.035 J-tipped guidewire was threaded into the right femoral vein and into the inferior vena cava followed by the right atrium under fluoroscopic guidance.   Next sequential 12 and 14 dilators were placed over the guidewire into the right femoral vein.  A 16 French dilator with a peel-away sheath was then placed over the guidewire into the right femoral vein.   The guidewire and dilator were removed. A 55 cm Palindrome catheter was then placed through the peel away sheath into the right atrium.  The catheter was then tunneled subcutaneously, cut to length, and the hub attached. The catheter was noted to flush and draw easily. The catheter was inspected under fluoroscopy and found with its tip to be in the right atrium without any kinks throughout its course. The catheter was sutured to the skin with nylon sutures. The groin insertion site was closed  with Vicryl stitch. The catheter was then loaded with concentrated Heparin solution. A dry sterile dressing was applied.  The patient tolerated procedure well and there were no complications. Instrument sponge and needle counts were correct at the end of the case. The patient was taken to the recovery room in stable condition. Chest x-ray will be obtained in the recovery room.  Fabienne Brunsharles Fields, MD [facility] [place] Office: [phone] Pager: [phone]

## 2015-10-03 NOTE — Progress Notes (Signed)
Pt for transfer to OR for hemodialysis cath placement with consent, NPO midnight given pain meds , wife at the bedside, 02 sat 100% room air but the pt said he having trouble breathing given 02 [place] at 1l/min, will continue to monitor.

## 2015-10-03 NOTE — Anesthesia Procedure Notes (Addendum)
Procedure Name: Intubation Date/Time: 10/03/2015 7:50 AM Performed by: Adonis HousekeeperNGELL, Waynesha Rammel M Pre-anesthesia Checklist: Emergency Drugs available, Patient identified, Suction available and Patient being monitored Patient Re-evaluated:Patient Re-evaluated prior to inductionOxygen Delivery Method: Circle system utilized Preoxygenation: Pre-oxygenation with 100% oxygen Intubation Type: IV induction Ventilation: Mask ventilation without difficulty and Two handed mask ventilation required Laryngoscope Size: Mac and 3 Grade View: Grade IV Tube type: Oral Tube size: 7.0 mm Number of attempts: 2 Airway Equipment and Method: Stylet and Bougie stylet Placement Confirmation: ETT inserted through vocal cords under direct vision,  positive ETCO2 and breath sounds checked- equal and bilateral Secured at: 22 cm Tube secured with: Tape Dental Injury: Teeth and Oropharynx as per pre-operative assessment  Difficulty Due To: Difficult Airway- due to large tongue and Difficulty was anticipated

## 2015-10-03 NOTE — Transfer of Care (Signed)
Immediate Anesthesia Transfer of Care Note  Patient: Robert BernheimJoe Bradshaw  Procedure(s) Performed: Procedure(s): INSERTION OF DIALYSIS CATHETER RIGHT FEMORAL VEIN (N/A)  Patient Location: PACU  Anesthesia Type:General  Level of Consciousness: awake, alert , oriented and patient cooperative  Airway & Oxygen Therapy: Patient Spontanous Breathing and Patient connected to face mask oxygen  Post-op Assessment: Report given to RN, Post -op Vital signs reviewed and stable and Patient moving all extremities  Post vital signs: Reviewed and stable  Last Vitals:  Filed Vitals:   10/02/15 1828 10/02/15 2126  BP: 137/86 159/89  Pulse: 77 83  Temp: 36.4 C 36.6 C  Resp: 16 18    Last Pain:  Filed Vitals:   10/03/15 0622  PainSc: 8          Complications: No apparent anesthesia complications

## 2015-10-04 MED ORDER — CAMPHOR-MENTHOL 0.5-0.5 % EX LOTN
TOPICAL_LOTION | CUTANEOUS | Status: DC | PRN
  Administered 2015-10-04: 15:00:00 via TOPICAL
  Filled 2015-10-04: qty 222

## 2015-10-04 MED ORDER — METHYLPREDNISOLONE 4 MG PO TBPK: 4.0000 mg | ORAL_TABLET | ORAL | Status: DC

## 2015-10-04 MED ORDER — PIPERACILLIN-TAZOBACTAM IN DEX 2-0.25 GM/50ML IV SOLN
2.2500 g | Freq: Three times a day (TID) | INTRAVENOUS | Status: DC
  Administered 2015-10-04 – 2015-10-07 (×8): 2.25 g via INTRAVENOUS
  Filled 2015-10-04 (×13): qty 50

## 2015-10-04 MED ORDER — METHYLPREDNISOLONE 4 MG PO TBPK: 4.0000 mg | ORAL_TABLET | Freq: Four times a day (QID) | ORAL | Status: DC

## 2015-10-04 MED ORDER — WHITE PETROLATUM GEL
Status: AC
  Administered 2015-10-04: 03:00:00
  Filled 2015-10-04: qty 1

## 2015-10-04 MED ORDER — METHYLPREDNISOLONE 4 MG PO TBPK: 8.0000 mg | ORAL_TABLET | Freq: Every evening | ORAL | Status: DC

## 2015-10-04 MED ORDER — METHYLPREDNISOLONE 4 MG PO TBPK
4.0000 mg | ORAL_TABLET | ORAL | Status: AC
  Administered 2015-10-04: 4 mg via ORAL

## 2015-10-04 MED ORDER — METHYLPREDNISOLONE 4 MG PO TBPK: 8.0000 mg | ORAL_TABLET | Freq: Every morning | ORAL | Status: DC

## 2015-10-04 MED ORDER — HYDROXYZINE HCL 25 MG PO TABS
25.0000 mg | ORAL_TABLET | Freq: Three times a day (TID) | ORAL | Status: DC | PRN
  Administered 2015-10-04 – 2015-10-05 (×3): 25 mg via ORAL
  Filled 2015-10-04 (×3): qty 1

## 2015-10-04 MED ORDER — METHYLPREDNISOLONE 4 MG PO TBPK
4.0000 mg | ORAL_TABLET | Freq: Four times a day (QID) | ORAL | Status: DC
  Administered 2015-10-06 – 2015-10-07 (×3): 4 mg via ORAL

## 2015-10-04 MED ORDER — VANCOMYCIN HCL IN DEXTROSE 750-5 MG/150ML-% IV SOLN
750.0000 mg | INTRAVENOUS | Status: DC
  Administered 2015-10-06: 750 mg via INTRAVENOUS
  Filled 2015-10-04 (×2): qty 150

## 2015-10-04 MED ORDER — METHYLPREDNISOLONE 4 MG PO TBPK
8.0000 mg | ORAL_TABLET | Freq: Every morning | ORAL | Status: AC
  Administered 2015-10-04: 8 mg via ORAL
  Filled 2015-10-04: qty 21

## 2015-10-04 MED ORDER — METHYLPREDNISOLONE 4 MG PO TBPK
8.0000 mg | ORAL_TABLET | Freq: Every evening | ORAL | Status: AC
  Administered 2015-10-04: 8 mg via ORAL

## 2015-10-04 MED ORDER — METHYLPREDNISOLONE 4 MG PO TBPK
4.0000 mg | ORAL_TABLET | Freq: Three times a day (TID) | ORAL | Status: AC
  Administered 2015-10-05 (×3): 4 mg via ORAL
  Filled 2015-10-04: qty 21

## 2015-10-04 MED ORDER — METHYLPREDNISOLONE 4 MG PO TBPK
8.0000 mg | ORAL_TABLET | Freq: Every evening | ORAL | Status: AC
  Administered 2015-10-05: 8 mg via ORAL

## 2015-10-04 MED ORDER — METHYLPREDNISOLONE 4 MG PO TBPK: 4.0000 mg | ORAL_TABLET | Freq: Three times a day (TID) | ORAL | Status: DC

## 2015-10-04 MED ORDER — PIPERACILLIN-TAZOBACTAM IN DEX 2-0.25 GM/50ML IV SOLN
2.2500 g | Freq: Three times a day (TID) | INTRAVENOUS | Status: DC
  Filled 2015-10-04: qty 50

## 2015-10-04 MED ORDER — PIPERACILLIN-TAZOBACTAM 3.375 G IVPB 30 MIN
3.3750 g | Freq: Once | INTRAVENOUS | Status: AC
  Administered 2015-10-04: 3.375 g via INTRAVENOUS
  Filled 2015-10-04 (×2): qty 50

## 2015-10-04 MED ORDER — DIPHENHYDRAMINE HCL 25 MG PO CAPS
25.0000 mg | ORAL_CAPSULE | ORAL | Status: DC | PRN
  Administered 2015-10-04 – 2015-10-06 (×4): 25 mg via ORAL
  Filled 2015-10-04 (×3): qty 1

## 2015-10-04 MED ORDER — VANCOMYCIN HCL 10 G IV SOLR
1500.0000 mg | Freq: Once | INTRAVENOUS | Status: AC
  Administered 2015-10-04: 1500 mg via INTRAVENOUS
  Filled 2015-10-04 (×2): qty 1500

## 2015-10-04 NOTE — Progress Notes (Signed)
Robert Bradshaw   Subjective:   Interval History: Itching and swelling facial lips and right arm  Has central vein stenosis but convinced is allergic to graft material   Objective:  Vital signs in last 24 hours:  Temp:  [97.6 F (36.4 C)-98.2 F (36.8 C)] 97.6 F (36.4 C) (05/14 0529) Pulse Rate:  [82-116] 99 (05/14 0529) Resp:  [14-18] 18 (05/14 0529) BP: (121-167)/(55-89) 121/81 mmHg (05/14 0529) SpO2:  [96 %-100 %] 100 % (05/14 0529) Weight:  [80 kg (176 lb 5.9 oz)-81.6 kg (179 lb 14.3 oz)] 80 kg (176 lb 5.9 oz) (05/13 2052)  Weight change:  Filed Weights   10/03/15 1826 10/03/15 2052  Weight: 81.6 kg (179 lb 14.3 oz) 80 kg (176 lb 5.9 oz)    Intake/Output: I/O last 3 completed shifts: In: 605 [P.O.:255; I.V.:300; IV Piggyback:50] Out: 1869 [Urine:300; ZOXWR:6045Other:1559; Blood:10]   Intake/Output this shift:  Total I/O In: 120 [P.O.:120] Out: -   General: alert,AAM, NAD, facial edema noted /ust walked back from bathroom to bed Heart: RRR, no mur , rub or gallop Lungs: CTA bilat / nonlabored breathing Abdomen: soft , NT, ND Extremities: Swelling R arm / no pedal edema Dialysis Access: R Fem Perm cath with mild bloody oozing on dressing (pt was just up waling to BR)/ pos . Bruit R FA AVG with swelling bandaged proximal site clean /dry  OP [facility] 4h 15min 77.5kg 2/k 2.25 ca bath RFA AVG Hep 8000 Hect 4 ug/Venofer 50/wk Last op lab s= HGB 12.1 5/04 Ca 8.2 phos 7.7 pth 2419< 2473   Basic Metabolic Panel:  Recent Labs Lab 09/29/15 1230 09/30/15 1718 10/01/15 0727 10/03/15 0541  NA 140 138 135 140  K 4.2 3.9 4.8 4.3  CL 93* 93* 91* 97*  CO2 25 23 22  18*  GLUCOSE 89 74 111* 77  BUN 65* 74* 78* 96*  CREATININE 18.96* 21.76* 23.25* 27.81*  CALCIUM 8.6* 9.1 8.5* 8.1*    Liver Function Tests:  Recent Labs Lab 09/29/15 1230 09/30/15 1718  AST 23 21  ALT 14* 14*  ALKPHOS 235* 229*  BILITOT 0.8 0.8  PROT 8.3* 7.7   ALBUMIN 3.8 3.6   No results for input(s): LIPASE, AMYLASE in the last 168 hours. No results for input(s): AMMONIA in the last 168 hours.  CBC:  Recent Labs Lab 09/29/15 1230 09/30/15 1718 10/01/15 0727 10/03/15 0541  WBC 6.6 6.5 4.7 4.8  NEUTROABS 4.5  --   --   --   HGB 11.7* 10.6* 11.2* 10.5*  HCT 35.4* 32.4* 33.3* 31.0*  MCV 89.6 88.3 90.5 89.6  PLT 177 159 176 161    Cardiac Enzymes: No results for input(s): CKTOTAL, CKMB, CKMBINDEX, TROPONINI in the last 168 hours.  BNP: Invalid input(s): POCBNP  CBG: No results for input(s): GLUCAP in the last 168 hours.  Microbiology: Results for orders placed or performed during the hospital encounter of 09/22/15  MRSA PCR Screening     Status: None   Collection Time: 09/23/15  2:30 AM  Result Value Ref Range Status   MRSA by PCR NEGATIVE NEGATIVE Final    Comment:        The GeneXpert MRSA Assay (FDA approved for NASAL specimens only), is one component of a comprehensive MRSA colonization surveillance program. It is not intended to diagnose MRSA infection nor to guide or monitor treatment for MRSA infections.     Coagulation Studies: No results for input(s): LABPROT, INR in the  last 72 hours.  Urinalysis: No results for input(s): COLORURINE, LABSPEC, PHURINE, GLUCOSEU, HGBUR, BILIRUBINUR, KETONESUR, PROTEINUR, UROBILINOGEN, NITRITE, LEUKOCYTESUR in the last 72 hours.  Invalid input(s): APPERANCEUR    Imaging: Dg Chest Port 1 View  10/03/2015  CLINICAL DATA:  Status post femoral dialysis catheter placement EXAM: PORTABLE CHEST 1 VIEW COMPARISON:  Sep 30, 2015 FINDINGS: The right dialysis catheter terminates either within the right atrium or within the central SVC. The inferior caval atrial junction is 9 cm proximal to the distal tip. No pneumothorax. Mild pulmonary edema. No other acute interval changes. IMPRESSION: The right femoral catheter terminates either within the right atrium or the central SVC. Consider  withdrawing several cm. The caval atrial junction is 9 cm proximal to the tip. Mild edema. These results will be called to the ordering clinician or representative by the Radiologist Assistant, and communication documented in the PACS or zVision Dashboard. Electronically Signed   By: Gerome Sam III M.D   On: 10/03/2015 09:29   Dg Horace Porteous Guide Cv Line-no Report  10/03/2015  CLINICAL DATA:  FLOURO GUIDE CV LINE Fluoroscopy was utilized by the requesting physician.  No radiographic interpretation.     Medications:   . sodium chloride     . busPIRone  10 mg Oral BID  . cinacalcet  120 mg Oral Q breakfast  . doxercalciferol  4 mcg Intravenous Q T,Th,Sa-HD  . efavirenz  600 mg Oral QHS  . ferric gluconate (FERRLECIT/NULECIT) IV  62.5 mg Intravenous Q Thu-HD  . lamiVUDine  150 mg Oral Once per day on Mon Thu  . levETIRAcetam  500 mg Oral BID  . multivitamins with iron  1 tablet Oral Daily  . pantoprazole (PROTONIX) IV  40 mg Intravenous Q12H  . piperacillin-tazobactam (ZOSYN)  IV  2.25 g Intravenous Q8H  . piperacillin-tazobactam  3.375 g Intravenous Once  . tenofovir  300 mg Oral Weekly  . vancomycin  1,500 mg Intravenous Once  . [START ON 10/06/2015] vancomycin  750 mg Intravenous Q T,Th,Sa-HD   acetaminophen **OR** acetaminophen, diphenhydrAMINE, hydrALAZINE, lidocaine, ondansetron **OR** ondansetron (ZOFRAN) IV, oxyCODONE  1. Recurrent hematemesis - HGB sable at 10.5< 11.2 ,10.6 /usually associated with coughing - no significant change in hgb; GI following on PPI, H2 blocker and 2. ESRD - TTS - [facility], k 4.3, 97 bun, cr^= 28.7 ^ BUN/Cr due to lack of dialysis ; planned for serial HD to lower numbers however patient is complaining of pain swelling in Right forearm graft -Dr. Darrick Penna eval / today has placed Fem perm cath/ / rest AVGG And Fu in VVS ov 31month. ??   3. Anemiaof ESRD and GI Bld - montior hgb weekly Fe ; no ESA needed yet 4. Metabolic bone disease - cotninue current  meds, hectorol -iPTH 2400 - uncontrolled;- on aurixya as outpt - change to velphoro here Ca 9.1 Alb 3.6  5. Nutrition- changed to regular diet per wife's request in Past he lest AMA Because he had RENAL diet 6. HIV- meds per primary 7. Hx of polysubstance abuse 8. Hx of dialysis noncompliance secondary to swelling and pain over graft/Access- hx of right sub occlusion 08/2015 venogram -- not convinced that this appears as though there is a reaction to gortex material -- will try medrol dose pak     LOS: 2 Neeta Storey W  :45 PM

## 2015-10-04 NOTE — Progress Notes (Signed)
Patient ID: Robert Bradshaw, male   DOB: 1981/01/14, 35 y.o.   MRN: [id number]   PROGRESS NOTE    Robert Bradshaw  WUJ:[id number] DOB: 02-06-1981 DOA: 09/30/2015  PCP: Jeanine Luz, FNP   Outpatient Specialists:   Brief Narrative:  35 y.o. male with a past medical history significant for ESRD on HD TThS, HIV well controlled who presented with hematemesis, last EGD April 2017 with grade I varices and non bleeding ulcer. He missed last HD on Tuesday.   Major events since admission: 5/14 - started vanc and zosyn as R forearm AV graft infected   Assessment & Plan:  Hematemesis in setting of known ulcer and grade I varices - no further episodes - Hg slightly down in the past 24 hours  - no indication for endoscopy at this time per GI team - continue PPI  Right forearm AV graft infection  - some tenderness and edema and likely infected  - started vancomycin and zosyn  - plan to remove graft in OR tomorrow  HIV - HIV RNA undetect last month. Missed establishment of care appt with Dr. Daiva Eves last week - Continue home antiretrovirals  HTN, essential  - off antihypertensives  - BP stable this AM   ESRD on HD TThS - per nephrology team - malfunctioning right forarm graft - vascular surgery placed Fem perm cath 5/13  Hx of seizures - Continue Keppra - no seizures here   Combination anemia of renal disease and acute blood loss anemia  - no indication for transfusion  - CBC In AM  Obesity  - Body mass index is 30.26 kg/(m^2).   DVT prophylaxis: SCD's Code Status: Full  Family Communication: Patient and wife at bedside  Disposition Plan: Home when cleared by nephrology team   Consultants:   Nephrology  GI  Procedures:   GI  Antimicrobials:  Rocephin 5/10 --> 5/12  Subjective: Says he is tired, wants to rest. Also asking to make sure he can have double portions of food.   Objective: Filed Vitals:   10/03/15 2055 10/03/15 2220 10/04/15 0259 10/04/15 0529  BP:  140/73 132/63 152/63 121/81  Pulse: 100 93 94 99  Temp:  98.1 F (36.7 C) 97.8 F (36.6 C) 97.6 F (36.4 C)  TempSrc:  Oral Oral Oral  Resp:  Weight:      SpO2:  97% 100% 100%    Intake/Output Summary (Last 24 hours) at 10/04/15 1201 Last data filed at 10/04/15 0836  Gross per 24 hour  Intake    240 ml  Output   1859 ml  Net  -1619 ml   Examination:  General exam: Appears calm and comfortable  Respiratory system: Clear to auscultation. Respiratory effort normal. Cardiovascular system: S1 & S2 heard, RRR. No JVD, rubs, gallops or clicks. No pedal edema. Gastrointestinal system: Abdomen is nondistended, soft and nontender.  Dialysis Access: AVGG forearm, swelling and TTP  Data Reviewed: I have personally reviewed following labs and imaging studies  CBC:  Recent Labs Lab 09/29/15 1230 09/30/15 1718 10/01/15 0727 10/03/15 0541  WBC 6.6 6.5 4.7 4.8  NEUTROABS 4.5  --   --   --   HGB 11.7* 10.6* 11.2* 10.5*  HCT 35.4* 32.4* 33.3* 31.0*  MCV 89.6 88.3 90.5 89.6  PLT 177 159 176 161   Basic Metabolic Panel:  Recent Labs Lab 09/29/15 1230 09/30/15 1718 10/01/15 0727 10/03/15 0541  NA 140 138 135 140  K 4.2 3.9 4.8 4.3  CL 93* 93* 91* 97*  CO2 25 23 22  18*  GLUCOSE 89 74 111* 77  BUN 65* 74* 78* 96*  CREATININE 18.96* 21.76* 23.25* 27.81*  CALCIUM 8.6* 9.1 8.5* 8.1*   Liver Function Tests:  Recent Labs Lab 09/29/15 1230 09/30/15 1718  AST 23 21  ALT 14* 14*  ALKPHOS 235* 229*  BILITOT 0.8 0.8  PROT 8.3* 7.7  ALBUMIN 3.8 3.6   Recent Results (from the past 240 hour(s))  MRSA PCR Screening     Status: None   Collection Time: 09/23/15  2:30 AM  Result Value Ref Range Status   MRSA by PCR NEGATIVE NEGATIVE Final    Radiology Studies: X-ray Chest Pa And Lateral 09/30/2015  No active cardiopulmonary disease.    Scheduled Meds: . busPIRone  10 mg Oral BID  . cinacalcet  120 mg Oral Q breakfast  . doxercalciferol  4 mcg Intravenous Q  T,Th,Sa-HD  . efavirenz  600 mg Oral QHS  . ferric gluconate (FERRLECIT/NULECIT) IV  62.5 mg Intravenous Q Thu-HD  . lamiVUDine  150 mg Oral Once per day on Mon Thu  . levETIRAcetam  500 mg Oral BID  . multivitamins with iron  1 tablet Oral Daily  . pantoprazole (PROTONIX) IV  40 mg Intravenous Q12H  . piperacillin-tazobactam (ZOSYN)  IV  2.25 g Intravenous Q8H  . piperacillin-tazobactam  3.375 g Intravenous Once  . tenofovir  300 mg Oral Weekly  . vancomycin  1,500 mg Intravenous Once  . [START ON 10/06/2015] vancomycin  750 mg Intravenous Q T,Th,Sa-HD   Continuous Infusions: . sodium chloride     Time spent: 20 minutes   Debbora PrestoMAGICK-Kamar Callender, MD [facility] Pager [phone]  If 7PM-7AM, please contact night-coverage [email or web address] Password [email or web address] 10/04/2015, 12:01 PM

## 2015-10-04 NOTE — Consult Note (Signed)
Asked by Dr Hyman HopesWebb to see pt for new rash and graft swelling  Filed Vitals:   10/03/15 2055 10/03/15 2220 10/04/15 0259 10/04/15 0529  BP: 140/73 132/63 152/63 121/81  Pulse: 100 93 94 99  Temp:  98.1 F (36.7 C) 97.8 F (36.6 C) 97.6 F (36.4 C)  TempSrc:  Oral Oral Oral  Resp:  16 18 18   Weight:      SpO2:  97% 100% 100%    Right forearm AV graft some tenderness and edema but not really changed.  Scattered areas of purpura left arm chest abdoment each about 3 cm diameter  A: Most likely has infection right forearm AV graft, pt states similar rash with last graft infection  P: Vanc/Zosyn No steroids Remove graft in OR tomorrow NPo p midnight consent  Fabienne Brunsharles Khamari Sheehan, MD [facility] [place] Office: [phone] Pager: [phone]

## 2015-10-04 NOTE — Progress Notes (Signed)
Pt. having periods of apnea lasting up to 20sec. each 5th - 6th breath. 02 2l per n/c on.

## 2015-10-04 NOTE — Progress Notes (Signed)
Pharmacy Antibiotic Note  Robert BernheimJoe Bradshaw is a 35 y.o. male admitted on 09/30/2015 with wound infection.  Pharmacy has been consulted for Zosyn and vancomycin dosing. PMH includes ESRD with HD-TTS.  Day #1 of abx for wound infection. Most likely has infection of R forearm AV graft. Plan is to remove graft in OR tomorrow. Last HD session was on 5/13. May get HD again today?  Plan: Stop ceftriaxone Give Zosyn 3.375g IV x 1, then start Zosyn 2.25g IV Q8 Give vancomycin 1.5g IV x 1, then start vancomycin 750mg  IV QHD-TTS Monitor clinical picture, HD sessions F/U C&S, abx deescalation / LOT   Weight: 176 lb 5.9 oz (80 kg)  Temp (24hrs), Avg:98 F (36.7 C), Min:97.6 F (36.4 C), Max:98.2 F (36.8 C)   Recent Labs Lab 09/29/15 1230 09/30/15 1718 10/01/15 0727 10/03/15 0541  WBC 6.6 6.5 4.7 4.8  CREATININE 18.96* 21.76* 23.25* 27.81*    Estimated Creatinine Clearance: 3.5 mL/min (by C-G formula based on Cr of 27.81).    No Known Allergies  Antimicrobials this admission: Zosyn 5/14 >>  Vancomycin 5/14 >>   Dose adjustments this admission: n/a  Microbiology results: n/a  Thank you for allowing pharmacy to be a part of this patient's care.  Robert BiNathan Alic Bradshaw, PharmD, BCPS Clinical Pharmacist Pager [phone] 10/04/2015 10:20 AM

## 2015-10-05 ENCOUNTER — Inpatient Hospital Stay (HOSPITAL_COMMUNITY): Payer: Medicare Other | Admitting: Anesthesiology

## 2015-10-05 ENCOUNTER — Encounter (HOSPITAL_COMMUNITY)
Admission: EM | Disposition: A | Discharge status: Home / Self Care | Payer: Self-pay | Source: Home / Self Care | Attending: Internal Medicine

## 2015-10-05 ENCOUNTER — Encounter (HOSPITAL_COMMUNITY): Payer: Self-pay | Admitting: Vascular Surgery

## 2015-10-05 HISTORY — PX: AVGG REMOVAL: SHX5153

## 2015-10-05 LAB — RENAL FUNCTION PANEL
ALBUMIN: 3.3 g/dL — AB (ref 3.5–5.0)
Anion gap: 20 — ABNORMAL HIGH (ref 5–15)
BUN: 76 mg/dL — AB (ref 6–20)
CALCIUM: 7.9 mg/dL — AB (ref 8.9–10.3)
CO2: 18 mmol/L — ABNORMAL LOW (ref 22–32)
CREATININE: 24.81 mg/dL — AB (ref 0.61–1.24)
Chloride: 97 mmol/L — ABNORMAL LOW (ref 101–111)
GFR calc Af Amer: 2 mL/min — ABNORMAL LOW (ref >60)
GFR, EST NON AFRICAN AMERICAN: 2 mL/min — AB (ref >60)
GLUCOSE: 79 mg/dL (ref 65–99)
PHOSPHORUS: 8.6 mg/dL — AB (ref 2.5–4.6)
Potassium: 4.8 mmol/L (ref 3.5–5.1)
SODIUM: 135 mmol/L (ref 135–145)

## 2015-10-05 LAB — CBC
HCT: 28.9 % — ABNORMAL LOW (ref 39.0–52.0)
Hemoglobin: 9.6 g/dL — ABNORMAL LOW (ref 13.0–17.0)
MCH: 29.5 pg (ref 26.0–34.0)
MCHC: 33.2 g/dL (ref 30.0–36.0)
MCV: 88.9 fL (ref 78.0–100.0)
PLATELETS: 122 10*3/uL — AB (ref 150–400)
RBC: 3.25 MIL/uL — ABNORMAL LOW (ref 4.22–5.81)
RDW: 16.3 % — AB (ref 11.5–15.5)
WBC: 4.7 10*3/uL (ref 4.0–10.5)

## 2015-10-05 LAB — SURGICAL PCR SCREEN
MRSA, PCR: NEGATIVE
Staphylococcus aureus: POSITIVE — AB

## 2015-10-05 SURGERY — REMOVAL OF ARTERIOVENOUS GORETEX GRAFT (AVGG)
Anesthesia: General | Site: Arm Lower | Laterality: Right

## 2015-10-05 MED ORDER — LIDOCAINE HCL (PF) 1 % IJ SOLN
INTRAMUSCULAR | Status: AC
  Filled 2015-10-05: qty 30

## 2015-10-05 MED ORDER — PROPOFOL 10 MG/ML IV BOLUS
INTRAVENOUS | Status: AC
  Filled 2015-10-05: qty 20

## 2015-10-05 MED ORDER — HYDROMORPHONE HCL 1 MG/ML IJ SOLN
INTRAMUSCULAR | Status: AC
  Filled 2015-10-05: qty 1

## 2015-10-05 MED ORDER — SODIUM CHLORIDE 0.9 % IV SOLN
INTRAVENOUS | Status: DC | PRN
  Administered 2015-10-05: 500 mL

## 2015-10-05 MED ORDER — PHENYLEPHRINE HCL 10 MG/ML IJ SOLN
INTRAMUSCULAR | Status: DC | PRN
  Administered 2015-10-05: 80 ug via INTRAVENOUS

## 2015-10-05 MED ORDER — TENOFOVIR DISOPROXIL FUMARATE 300 MG PO TABS
300.0000 mg | ORAL_TABLET | ORAL | Status: DC
  Filled 2015-10-05: qty 1

## 2015-10-05 MED ORDER — MUPIROCIN 2 % EX OINT
TOPICAL_OINTMENT | Freq: Two times a day (BID) | CUTANEOUS | Status: DC
  Administered 2015-10-05 – 2015-10-07 (×4): via NASAL
  Filled 2015-10-05 (×2): qty 22

## 2015-10-05 MED ORDER — LAMIVUDINE 150 MG PO TABS
150.0000 mg | ORAL_TABLET | ORAL | Status: DC
  Filled 2015-10-05: qty 1

## 2015-10-05 MED ORDER — ONDANSETRON HCL 4 MG/2ML IJ SOLN
INTRAMUSCULAR | Status: DC | PRN
Start: 2015-10-05 — End: 2015-10-05
  Administered 2015-10-05: 4 mg via INTRAVENOUS

## 2015-10-05 MED ORDER — MIDAZOLAM HCL 5 MG/5ML IJ SOLN
INTRAMUSCULAR | Status: DC | PRN
  Administered 2015-10-05: 2 mg via INTRAVENOUS

## 2015-10-05 MED ORDER — FENTANYL CITRATE (PF) 250 MCG/5ML IJ SOLN
INTRAMUSCULAR | Status: AC
  Filled 2015-10-05: qty 5

## 2015-10-05 MED ORDER — 0.9 % SODIUM CHLORIDE (POUR BTL) OPTIME
TOPICAL | Status: DC | PRN
  Administered 2015-10-05: 1000 mL

## 2015-10-05 MED ORDER — MIDAZOLAM HCL 2 MG/2ML IJ SOLN
INTRAMUSCULAR | Status: AC
  Filled 2015-10-05: qty 2

## 2015-10-05 MED ORDER — LIDOCAINE HCL (CARDIAC) 20 MG/ML IV SOLN
INTRAVENOUS | Status: DC | PRN
  Administered 2015-10-05: 60 mg via INTRAVENOUS

## 2015-10-05 MED ORDER — PIPERACILLIN-TAZOBACTAM IN DEX 2-0.25 GM/50ML IV SOLN
2.2500 g | INTRAVENOUS | Status: AC
  Administered 2015-10-05: 2.25 g via INTRAVENOUS
  Filled 2015-10-05: qty 50

## 2015-10-05 MED ORDER — HYDROMORPHONE HCL 1 MG/ML IJ SOLN
INTRAMUSCULAR | Status: DC | PRN
  Administered 2015-10-05: 1 mg via INTRAVENOUS

## 2015-10-05 MED ORDER — PROPOFOL 10 MG/ML IV BOLUS
INTRAVENOUS | Status: DC | PRN
  Administered 2015-10-05: 50 mg via INTRAVENOUS
  Administered 2015-10-05: 130 mg via INTRAVENOUS
  Administered 2015-10-05: 20 mg via INTRAVENOUS

## 2015-10-05 MED ORDER — ONDANSETRON HCL 4 MG/2ML IJ SOLN
INTRAMUSCULAR | Status: AC
  Filled 2015-10-05: qty 2

## 2015-10-05 MED ORDER — DARBEPOETIN ALFA 100 MCG/0.5ML IJ SOSY
100.0000 ug | PREFILLED_SYRINGE | INTRAMUSCULAR | Status: DC
  Administered 2015-10-06: 100 ug via INTRAVENOUS
  Filled 2015-10-05: qty 0.5

## 2015-10-05 MED ORDER — HYDROMORPHONE HCL 1 MG/ML IJ SOLN
0.2500 mg | INTRAMUSCULAR | Status: DC | PRN
  Administered 2015-10-05 (×2): 0.5 mg via INTRAVENOUS

## 2015-10-05 MED ORDER — FENTANYL CITRATE (PF) 100 MCG/2ML IJ SOLN
INTRAMUSCULAR | Status: DC | PRN
  Administered 2015-10-05 (×2): 50 ug via INTRAVENOUS
  Administered 2015-10-05: 100 ug via INTRAVENOUS
  Administered 2015-10-05 (×2): 50 ug via INTRAVENOUS
  Administered 2015-10-05 (×2): 100 ug via INTRAVENOUS

## 2015-10-05 MED ORDER — HYDRALAZINE HCL 20 MG/ML IJ SOLN
INTRAMUSCULAR | Status: AC
  Filled 2015-10-05: qty 1

## 2015-10-05 SURGICAL SUPPLY — 40 items
BANDAGE ELASTIC 4 VELCRO ST LF (GAUZE/BANDAGES/DRESSINGS) ×4 IMPLANT
BNDG GAUZE ELAST 4 BULKY (GAUZE/BANDAGES/DRESSINGS) ×2 IMPLANT
CANISTER SUCTION 2500CC (MISCELLANEOUS) ×2 IMPLANT
CANNULA VESSEL 3MM 2 BLNT TIP (CANNULA) ×2 IMPLANT
CLIP TI MEDIUM 6 (CLIP) ×2 IMPLANT
CLIP TI WIDE RED SMALL 6 (CLIP) ×2 IMPLANT
DECANTER SPIKE VIAL GLASS SM (MISCELLANEOUS) IMPLANT
ELECT REM PT RETURN 9FT ADLT (ELECTROSURGICAL) ×2
ELECTRODE REM PT RTRN 9FT ADLT (ELECTROSURGICAL) ×1 IMPLANT
GEL ULTRASOUND 20GR AQUASONIC (MISCELLANEOUS) IMPLANT
GLOVE BIO SURGEON STRL SZ 6.5 (GLOVE) ×4 IMPLANT
GLOVE BIO SURGEON STRL SZ7.5 (GLOVE) ×2 IMPLANT
GLOVE BIOGEL PI IND STRL 6.5 (GLOVE) ×3 IMPLANT
GLOVE BIOGEL PI IND STRL 7.0 (GLOVE) ×1 IMPLANT
GLOVE BIOGEL PI INDICATOR 6.5 (GLOVE) ×3
GLOVE BIOGEL PI INDICATOR 7.0 (GLOVE) ×1
GOWN STRL REUS W/ TWL LRG LVL3 (GOWN DISPOSABLE) ×3 IMPLANT
GOWN STRL REUS W/TWL LRG LVL3 (GOWN DISPOSABLE) ×3
KIT BASIN OR (CUSTOM PROCEDURE TRAY) ×2 IMPLANT
KIT ROOM TURNOVER OR (KITS) ×2 IMPLANT
LIQUID BAND (GAUZE/BANDAGES/DRESSINGS) IMPLANT
NEEDLE HYPO 25GX1X1/2 BEV (NEEDLE) ×2 IMPLANT
NS IRRIG 1000ML POUR BTL (IV SOLUTION) ×2 IMPLANT
PACK CV ACCESS (CUSTOM PROCEDURE TRAY) ×2 IMPLANT
PAD ARMBOARD 7.5X6 YLW CONV (MISCELLANEOUS) ×4 IMPLANT
PADDING CAST COTTON 6X4 STRL (CAST SUPPLIES) ×2 IMPLANT
SPONGE GAUZE 4X4 12PLY STER LF (GAUZE/BANDAGES/DRESSINGS) ×2 IMPLANT
SPONGE LAP 18X18 X RAY DECT (DISPOSABLE) ×2 IMPLANT
SPONGE SURGIFOAM ABS GEL 100 (HEMOSTASIS) IMPLANT
STAPLER VISISTAT 35W (STAPLE) ×2 IMPLANT
SUT PROLENE 5 0 C 1 24 (SUTURE) ×6 IMPLANT
SUT PROLENE 6 0 CC (SUTURE) ×2 IMPLANT
SUT VIC AB 3-0 SH 27 (SUTURE) ×2
SUT VIC AB 3-0 SH 27X BRD (SUTURE) ×2 IMPLANT
SUT VICRYL 4-0 PS2 18IN ABS (SUTURE) ×2 IMPLANT
SWAB COLLECTION DEVICE MRSA (MISCELLANEOUS) ×2 IMPLANT
SWAB CULTURE ESWAB REG 1ML (MISCELLANEOUS) ×2 IMPLANT
SYR BULB IRRIGATION 50ML (SYRINGE) ×2 IMPLANT
UNDERPAD 30X30 INCONTINENT (UNDERPADS AND DIAPERS) ×2 IMPLANT
WATER STERILE IRR 1000ML POUR (IV SOLUTION) ×2 IMPLANT

## 2015-10-05 NOTE — Anesthesia Postprocedure Evaluation (Signed)
Anesthesia Post Note  Patient: Robert Bradshaw  Procedure(s) Performed: Procedure(s) (LRB): REMOVAL OF ARTERIOVENOUS GORETEX GRAFT (AVGG) RIGHT FOREARM (Right)  Patient location during evaluation: PACU Anesthesia Type: General Level of consciousness: awake and alert Pain management: pain level controlled Vital Signs Assessment: post-procedure vital signs reviewed and stable Respiratory status: spontaneous breathing, nonlabored ventilation, respiratory function stable and patient connected to nasal cannula oxygen Cardiovascular status: blood pressure returned to baseline and stable Postop Assessment: no signs of nausea or vomiting Anesthetic complications: no    Last Vitals:  Filed Vitals:   10/05/15 1720 10/05/15 1728  BP: 186/94 183/94  Pulse: 99 97  Temp:    Resp: 13 15    Last Pain:  Filed Vitals:   10/05/15 1730  PainSc: Asleep                 Keshav Winegar,W. EDMOND

## 2015-10-05 NOTE — Progress Notes (Signed)
Per MDA Fitzgerald patient should be monitored on ETCO2 monitor and pulse ox for the night. Will notify RN on [facility] for monitoring.

## 2015-10-05 NOTE — Transfer of Care (Signed)
Immediate Anesthesia Transfer of Care Note  Patient: Robert BernheimJoe Bradshaw  Procedure(s) Performed: Procedure(s): REMOVAL OF ARTERIOVENOUS GORETEX GRAFT (AVGG) RIGHT FOREARM (Right)  Patient Location: PACU  Anesthesia Type:General  Level of Consciousness: awake, oriented, sedated, patient cooperative and responds to stimulation  Airway & Oxygen Therapy: Patient Spontanous Breathing and Patient connected to face mask oxygen  Post-op Assessment: Report given to RN, Post -op Vital signs reviewed and stable, Patient moving all extremities and Patient moving all extremities X 4  Post vital signs: Reviewed and stable  Last Vitals:  Filed Vitals:   10/05/15 0423 10/05/15 1321  BP: 154/80 158/92  Pulse: 86 92  Temp: 36.4 C 36.4 C  Resp: 17 17    Last Pain:  Filed Vitals:   10/05/15 1322  PainSc: Asleep      Patients Stated Pain Goal: 2 (10/03/15 2200)  Complications: No apparent anesthesia complications

## 2015-10-05 NOTE — Op Note (Signed)
Procedure: Removal of infected right forearm AV graft  Preoperative diagnosis: #1 venous hypertension #2 infected right forearm AV graft  Postoperative diagnosis: Same  Anesthesia: Gen.  Assistant: Lianne CureMaureen Collins PA-C  Operative findings: #1 Areas of densely incorporated and unincorporated segments of graft                                 #2 cultures taken from graft                                 #3 engorged pulsatile backbleeding from venous branches secondary to venous hypertension  Operative details: After obtaining informed consent, the patient was taken to the operating room. After induction of general anesthesia and endotracheal ablation, the patient's entire right upper extremity was prepped and draped in usual sterile fashion. Next a transverse incision was made near the right antecubital crease carried down through the subcutaneous tissues to the level of the graft. The arterial and venous limbs of the graft were dissected free circumferentially. Dissection was carried down to level the venous anastomosis. On dissecting this out a injury was made to the heel anastomosis and there was significant bleeding briefly until this could be repaired with 2 5-0 Prolene sutures. A tourniquet was briefly inflated on the upper arm to control bleeding. Hemostasis was obtained. The vein was then dissected free circumferentially above and below the level of the anastomosis. The anastomosis appeared to be to an antecubital or cephalic vein. This was ligated proximally and distally to the anastomosis and the anastomosis taken down all the prosthetic material removed from the venous limb. Attention was then turned to the arterial limb of the graft and this was dissected free circumferentially and dissection carried down to level the arterial anastomosis. There were dense adhesions in this location and I did not feel that it was safe to completely dissect this out so I clamped the graft just above the level of  anastomosis transected it and oversewed it with a running 5-0 Prolene suture. The graft was transected just above this. The graft had several sections were poorly incorporated with edema fluid around them. There were also several sections that were densely scarred down and incorporated. This required opening the subcutaneous tissues almost for the full length of the graft to free this up. This was fairly tedious due to the fact the patient had venous hypertension and even small venous branches had significant pressurized bleeding which was controlled with cautery. After the grafted been completely removed and hemostasis was obtained the wounds were thoroughly irrigated with normal saline solution. The antecubital incision was closed with a running 3-0 Vicryl suture in the subcutaneous layer followed by staples in the skin. The forearm was very edematous and I did not feel I could close the subcutaneous tissues due to this. Therefore just the skin over the area of the previous graft was closed. The patient tolerated procedure well and there were no complications. The patient had audible radial Doppler signal at the end of the case. Dry sterile dressing and Ace wrap were wrapped snugly around the arm extending from the hand all way to the shoulder to decrease edema. Instrument sponge and needle count was correct in the case. The patient was taken to recover in stable condition.  Fabienne Brunsharles Fields, MD [facility] [place] Office: [phone] Pager: [phone]

## 2015-10-05 NOTE — Anesthesia Preprocedure Evaluation (Addendum)
Anesthesia Evaluation  Patient identified by MRN, date of birth, ID band Patient awake    Reviewed: Allergy & Precautions, H&P , NPO status , Patient's Chart, lab work & pertinent test results  Airway Mallampati: III  TM Distance: >3 FB Neck ROM: Full    Dental no notable dental hx. (+) Teeth Intact, Dental Advisory Given   Pulmonary Current Smoker,    Pulmonary exam normal breath sounds clear to auscultation       Cardiovascular hypertension, Pt. on medications negative cardio ROS   Rhythm:Regular Rate:Normal     Neuro/Psych  Headaches, Seizures -, Well Controlled,  negative psych ROS   GI/Hepatic Neg liver ROS, GERD  Medicated and Controlled,  Endo/Other  negative endocrine ROS  Renal/GU ESRF and DialysisRenal disease  negative genitourinary   Musculoskeletal   Abdominal   Peds  Hematology  (+) HIV,   Anesthesia Other Findings   Reproductive/Obstetrics negative OB ROS                            Anesthesia Physical Anesthesia Plan  ASA: III  Anesthesia Plan: General   Post-op Pain Management:    Induction: Intravenous  Airway Management Planned: LMA  Additional Equipment:   Intra-op Plan:   Post-operative Plan: Extubation in OR  Informed Consent: I have reviewed the patients History and Physical, chart, labs and discussed the procedure including the risks, benefits and alternatives for the proposed anesthesia with the patient or authorized representative who has indicated his/her understanding and acceptance.   Dental advisory given  Plan Discussed with: CRNA  Anesthesia Plan Comments:         Anesthesia Quick Evaluation

## 2015-10-05 NOTE — Anesthesia Procedure Notes (Signed)
Procedure Name: LMA Insertion Date/Time: 10/05/2015 2:39 PM Performed by: Wray KearnsFOLEY, Jomes Giraldo A Pre-anesthesia Checklist: Patient identified, Emergency Drugs available, Patient being monitored, Suction available and Timeout performed Patient Re-evaluated:Patient Re-evaluated prior to inductionOxygen Delivery Method: Circle system utilized Preoxygenation: Pre-oxygenation with 100% oxygen Intubation Type: IV induction Ventilation: Mask ventilation without difficulty LMA: LMA inserted LMA Size: 4.0 Tube type: Oral Number of attempts: 1 Placement Confirmation: positive ETCO2 and breath sounds checked- equal and bilateral Tube secured with: Tape Dental Injury: Teeth and Oropharynx as per pre-operative assessment

## 2015-10-05 NOTE — Progress Notes (Signed)
Patient ID: Robert BernheimJoe Bradshaw, male   DOB: 08/23/1980, 35 y.o.   MRN: [id number]   PROGRESS NOTE    Robert BernheimJoe Bradshaw  WUJ:[id number] DOB: 12/25/1980 DOA: 09/30/2015  PCP: Jeanine Luzalone, Gregory, FNP   Outpatient Specialists:   Brief Narrative:  35 y.o. male with a past medical history significant for ESRD on HD TThS, HIV well controlled who presented with hematemesis, last EGD April 2017 with grade I varices and non bleeding ulcer. He missed last HD on Tuesday.   Major events since admission: 5/14 - started vanc and zosyn as R forearm AV graft infected   Assessment & Plan:  Hematemesis in setting of known ulcer and grade I varices - no further episodes - Hg slightly down in the past 24 hours  - no indication for endoscopy at this time per GI team - continue PPI  Right forearm AV graft infection  - some tenderness and edema and likely infected  - started vancomycin and zosyn 5/14, today is day #2 of ABX - plan to remove graft in OR today   Skin rash - scattered brownish spots in the back and chest area - medrol pack started 5/14, improving   HIV - HIV RNA undetect last month. Missed establishment of care appt with Dr. Daiva EvesVan Dam last week - Continue home antiretrovirals  HTN, essential  - off antihypertensives  - BP stable this AM   ESRD on HD TThS - per nephrology team - malfunctioning right forarm graft - vascular surgery placed Fem perm cath 5/13  Hx of seizures - Continue Keppra - no seizures here   Thrombocytopenia - mild, reactive - monitor   Combination anemia of renal disease and acute blood loss anemia  - no indication for transfusion  - CBC In AM  Obesity  - Body mass index is 30.26 kg/(m^2).   DVT prophylaxis: SCD's Code Status: Full  Family Communication: Patient and wife at bedside  Disposition Plan: Home when cleared by nephrology and vascular surgery team   Consultants:   Nephrology  GI  Vascular surgery   Procedures:   GI  Antimicrobials:  Rocephin  5/10 --> 5/12  Subjective: Says he is tired, wants to rest. Also asking to make sure he can have double portions of food after procedure   Objective: Filed Vitals:   10/04/15 1308 10/04/15 2115 10/05/15 0423 10/05/15 1321  BP: 142/66 152/70 154/80 158/92  Pulse: 84 87 86 92  Temp: 97.8 F (36.6 C) 98.1 F (36.7 C) 97.5 F (36.4 C) 97.6 F (36.4 C)  TempSrc: Oral Oral Oral Oral  Resp: 18 18 17 17   Height:      Weight:      SpO2: 98% 100% 98% 99%    Intake/Output Summary (Last 24 hours) at 10/05/15 1342 Last data filed at 10/05/15 0855  Gross per 24 hour  Intake    790 ml  Output      0 ml  Net    790 ml   Examination:  General exam: Appears comfortable  Respiratory system: Clear to auscultation. Respiratory effort normal. Cardiovascular system: S1 & S2 heard, RRR. No JVD, rubs, gallops or clicks. No pedal edema. Gastrointestinal system: Abdomen is nondistended, soft and nontender.  Dialysis Access: AVGG forearm, swelling and TTP  Data Reviewed: I have personally reviewed following labs and imaging studies  CBC:  Recent Labs Lab 09/29/15 1230 09/30/15 1718 10/01/15 0727 10/03/15 0541 10/05/15 0446  WBC 6.6 6.5 4.7 4.8 4.7  NEUTROABS 4.5  --   --   --   --  HGB 11.7* 10.6* 11.2* 10.5* 9.6*  HCT 35.4* 32.4* 33.3* 31.0* 28.9*  MCV 89.6 88.3 90.5 89.6 88.9  PLT 177 159 176 161 122*   Basic Metabolic Panel:  Recent Labs Lab 09/29/15 1230 09/30/15 1718 10/01/15 0727 10/03/15 0541 10/05/15 0446  NA 140 138 135 140 135  K 4.2 3.9 4.8 4.3 4.8  CL 93* 93* 91* 97* 97*  CO2 18* 18*  GLUCOSE 89 74 111* 77 79  BUN 65* 74* 78* 96* 76*  CREATININE 18.96* 21.76* 23.25* 27.81* 24.81*  CALCIUM 8.6* 9.1 8.5* 8.1* 7.9*  PHOS  --   --   --   --  8.6*   Liver Function Tests:  Recent Labs Lab 09/29/15 1230 09/30/15 1718 10/05/15 0446  AST 23 21  --   ALT 14* 14*  --   ALKPHOS 235* 229*  --   BILITOT 0.8 0.8  --   PROT 8.3* 7.7  --   ALBUMIN 3.8  3.6 3.3*   Recent Results (from the past 240 hour(s))  MRSA PCR Screening     Status: None   Collection Time: 09/23/15  2:30 AM  Result Value Ref Range Status   MRSA by PCR NEGATIVE NEGATIVE Final    Radiology Studies: X-ray Chest Pa And Lateral 09/30/2015  No active cardiopulmonary disease.    Scheduled Meds: . [MAR Hold] busPIRone  10 mg Oral BID  . [MAR Hold] cinacalcet  120 mg Oral Q breakfast  . [MAR Hold] darbepoetin (ARANESP) injection - DIALYSIS  100 mcg Intravenous Q Tue-HD  . [MAR Hold] doxercalciferol  4 mcg Intravenous Q T,Th,Sa-HD  . [MAR Hold] efavirenz  600 mg Oral QHS  . [MAR Hold] ferric gluconate (FERRLECIT/NULECIT) IV  62.5 mg Intravenous Q Thu-HD  . [MAR Hold] lamiVUDine  150 mg Oral Once per day on Sun Thu  . [MAR Hold] levETIRAcetam  500 mg Oral BID  . [MAR Hold] methylPREDNISolone  4 mg Oral 3 x daily with food  . [MAR Hold] methylPREDNISolone  4 mg Oral 4X daily taper  . [MAR Hold] methylPREDNISolone  8 mg Oral Nightly  . [MAR Hold] multivitamins with iron  1 tablet Oral Daily  . [MAR Hold] pantoprazole (PROTONIX) IV  40 mg Intravenous Q12H  . [MAR Hold] piperacillin-tazobactam (ZOSYN)  IV  2.25 g Intravenous Q8H  . piperacillin-tazobactam (ZOSYN)  IV  2.25 g Intravenous to SS-Proc  . [MAR Hold] tenofovir  300 mg Oral Q Sun  . [MAR Hold] vancomycin  750 mg Intravenous Q T,Th,Sa-HD   Continuous Infusions: . sodium chloride     Time spent: 20 minutes   Debbora Presto, MD [facility] Pager [phone]  If 7PM-7AM, please contact night-coverage [email or web address] Password [email or web address] 10/05/2015, 1:42 PM

## 2015-10-05 NOTE — Progress Notes (Signed)
Subjective:  Noted for OR 300 pm removal AVGG / sat signed off hd early sec to pain per PT.  Objective Vital signs in last 24 hours: Filed Vitals:   10/04/15 0854 10/04/15 1308 10/04/15 2115 10/05/15 0423  BP:  142/66 152/70 154/80  Pulse:  84 87 86  Temp:  97.8 F (36.6 C) 98.1 F (36.7 C) 97.5 F (36.4 C)  TempSrc:  Oral Oral Oral  Resp:  18 18 17   Height: 5\' 4"  (1.626 m)     Weight: 80 kg (176 lb 5.9 oz)     SpO2:  98% 100% 98%   Weight change: -1.6 kg (-3 lb 8.4 oz)  Physical Exam: General: alert,AAM, NAD, facial edema   Heart: RRR, no mur , rub or gallop Lungs: CTA bilat / nonlabored breathing Abdomen: soft , NT, ND Extremities: Swelling R arm  With Scattered areas of purpura arm and chest/abd  / no pedal edema Dialysis Access: R Fem Perm cath / R FA AVG  Pos bruit and purpura rash   with arm swelling   [facility] 4h 15min 77.5kg 2/k 2.25 ca bath RFA AVG Hep 8000 Hect 4 ug/Venofer 50/wk Last op lab s= HGB 12.1 5/04 Ca 8.2 phos 7.7 pth 2419< 2473   Problem/Plan:    1.  Infected R FA AVGG - Dr. Darrick PennaFields plans removal today  2.  hx of right sub occlusion 08/2015 venogram/ swelling face and arm due to this also ([facility] 3. ESRD - TTS - [facility], k 4.8 77 bun, cr^= 24.78  Missing HD from reported pain R arm AVGG / Element noncompliance/ HD in am  4. Recurrent hematemesis   With HO  Known - ulcer and grade I varices HGB 9.6 / GI  Has signed off on   IV Protonix / Had coughing spell with BRB/coughing ?? Related to Vol up /Uremia from  missing HD also/ last Admit signed out AMA after ENDO  Because he had RENAL diet instead of reg. diet 5. Anemiaof ESRD and GI Bld - montior hgb weekly Fe/ start Aranesp 60 with next hd  6. Metabolic bone disease - cotninue current meds, hectorol -iPTH 2400 - uncontrolled;- on aurixya as outpt - change to velphoro here Ca 9.1 phos 8.6  Alb 3.3 7. Nutrition- changed to regular diet per wife's request  in Past he lest AMA Because he had RENAL diet 8. HIV- meds per primary 9. Hx of polysubstance abuse 10. Hx of dialysis noncompliance secondary to swelling and pain over graft/Access- -- not convinced   Lenny Pastelavid Zeyfang, PA-C [facility] Beeper [phone] 10/05/2015,11:11 AM  LOS: 3 days   Pt seen, examined and agree w A/P as above.  Vinson Moselleob Thekla Colborn MD [facility] pager [phone]    cell [phone] 10/05/2015, 5:34 PM    Labs: Basic Metabolic Panel:  Recent Labs Lab 10/01/15 0727 10/03/15 0541 10/05/15 0446  NA 135 140 135  K 4.8 4.3 4.8  CL 91* 97* 97*  CO2 22 18* 18*  GLUCOSE 111* 77 79  BUN 78* 96* 76*  CREATININE 23.25* 27.81* 24.81*  CALCIUM 8.5* 8.1* 7.9*  PHOS  --   --  8.6*   Liver Function Tests:  Recent Labs Lab 09/29/15 1230 09/30/15 1718 10/05/15 0446  AST 23 21  --   ALT 14* 14*  --   ALKPHOS 235* 229*  --   BILITOT 0.8 0.8  --   PROT 8.3* 7.7  --  ALBUMIN 3.8 3.6 3.3*   No results for input(s): LIPASE, AMYLASE in the last 168 hours. No results for input(s): AMMONIA in the last 168 hours. CBC:  Recent Labs Lab 09/29/15 1230 09/30/15 1718 10/01/15 0727 10/03/15 0541 10/05/15 0446  WBC 6.6 6.5 4.7 4.8 4.7  NEUTROABS 4.5  --   --   --   --   HGB 11.7* 10.6* 11.2* 10.5* 9.6*  HCT 35.4* 32.4* 33.3* 31.0* 28.9*  MCV 89.6 88.3 90.5 89.6 88.9  PLT 177 159 176 161 122*   Cardiac Enzymes: No results for input(s): CKTOTAL, CKMB, CKMBINDEX, TROPONINI in the last 168 hours. CBG: No results for input(s): GLUCAP in the last 168 hours.  Studies/Results: No results found. Medications: . sodium chloride     . busPIRone  10 mg Oral BID  . cinacalcet  120 mg Oral Q breakfast  . doxercalciferol  4 mcg Intravenous Q T,Th,Sa-HD  . efavirenz  600 mg Oral QHS  . ferric gluconate (FERRLECIT/NULECIT) IV  62.5 mg Intravenous Q Thu-HD  . lamiVUDine  150 mg Oral Once per day on Sun Thu  . levETIRAcetam  500 mg Oral BID  .  methylPREDNISolone  4 mg Oral 3 x daily with food  . [START ON 10/06/2015] methylPREDNISolone  4 mg Oral 4X daily taper  . methylPREDNISolone  8 mg Oral Nightly  . multivitamins with iron  1 tablet Oral Daily  . pantoprazole (PROTONIX) IV  40 mg Intravenous Q12H  . piperacillin-tazobactam (ZOSYN)  IV  2.25 g Intravenous Q8H  . tenofovir  300 mg Oral Q Sun  . [START ON 10/06/2015] vancomycin  750 mg Intravenous Q T,Th,Sa-HD

## 2015-10-05 NOTE — H&P (View-Only) (Signed)
Asked by Dr Webb to see pt for new rash and graft swelling  Filed Vitals:   10/03/15 2055 10/03/15 2220 10/04/15 0259 10/04/15 0529  BP: 140/73 132/63 152/63 121/81  Pulse: 100 93 94 99  Temp:  98.1 F (36.7 C) 97.8 F (36.6 C) 97.6 F (36.4 C)  TempSrc:  Oral Oral Oral  Resp:  16 18 18  Weight:      SpO2:  97% 100% 100%    Right forearm AV graft some tenderness and edema but not really changed.  Scattered areas of purpura left arm chest abdoment each about 3 cm diameter  A: Most likely has infection right forearm AV graft, pt states similar rash with last graft infection  P: Vanc/Zosyn No steroids Remove graft in OR tomorrow NPo p midnight consent  Charles Fields, MD [facility] Office: [phone] Pager: [phone]  

## 2015-10-05 NOTE — Interval H&P Note (Signed)
History and Physical Interval Note:  10/05/2015 2:29 PM  Robert Bradshaw  has presented today for surgery, with the diagnosis of POSSIBLE INFECTION RIGHT FOREARM AV GRAFT  The various methods of treatment have been discussed with the patient and family. After consideration of risks, benefits and other options for treatment, the patient has consented to  Procedure(s): REMOVAL OF ARTERIOVENOUS GORETEX GRAFT (AVGG) RIGHT FOREARM (Right) as a surgical intervention .  The patient's history has been reviewed, patient examined, no change in status, stable for surgery.  I have reviewed the patient's chart and labs.  Questions were answered to the patient's satisfaction.     Fabienne BrunsFields, Adalaya Irion

## 2015-10-05 NOTE — Progress Notes (Signed)
Returned from PACU at this time, a/ox4, denies nausea, c/o pain 10, reoriented to room and surroundings.

## 2015-10-06 ENCOUNTER — Encounter (HOSPITAL_COMMUNITY): Payer: Self-pay | Admitting: Vascular Surgery

## 2015-10-06 LAB — CBC
HCT: 28.8 % — ABNORMAL LOW (ref 39.0–52.0)
Hemoglobin: 9.4 g/dL — ABNORMAL LOW (ref 13.0–17.0)
MCH: 29.1 pg (ref 26.0–34.0)
MCHC: 32.6 g/dL (ref 30.0–36.0)
MCV: 89.2 fL (ref 78.0–100.0)
PLATELETS: 125 10*3/uL — AB (ref 150–400)
RBC: 3.23 MIL/uL — ABNORMAL LOW (ref 4.22–5.81)
RDW: 16.4 % — AB (ref 11.5–15.5)
WBC: 6.5 10*3/uL (ref 4.0–10.5)

## 2015-10-06 LAB — RENAL FUNCTION PANEL
Albumin: 3.1 g/dL — ABNORMAL LOW (ref 3.5–5.0)
Anion gap: 22 — ABNORMAL HIGH (ref 5–15)
BUN: 81 mg/dL — AB (ref 6–20)
CHLORIDE: 96 mmol/L — AB (ref 101–111)
CO2: 17 mmol/L — AB (ref 22–32)
CREATININE: 26.91 mg/dL — AB (ref 0.61–1.24)
Calcium: 7.3 mg/dL — ABNORMAL LOW (ref 8.9–10.3)
GFR calc Af Amer: 2 mL/min — ABNORMAL LOW (ref >60)
GFR calc non Af Amer: 2 mL/min — ABNORMAL LOW (ref >60)
Glucose, Bld: 85 mg/dL (ref 65–99)
Phosphorus: 11.5 mg/dL — ABNORMAL HIGH (ref 2.5–4.6)
Potassium: >7.5 mmol/L (ref 3.5–5.1)
Sodium: 135 mmol/L (ref 135–145)

## 2015-10-06 MED ORDER — DOXERCALCIFEROL 4 MCG/2ML IV SOLN
INTRAVENOUS | Status: AC
  Filled 2015-10-06: qty 2

## 2015-10-06 MED ORDER — OXYCODONE HCL 5 MG PO TABS
ORAL_TABLET | ORAL | Status: AC
  Filled 2015-10-06: qty 1

## 2015-10-06 MED ORDER — DARBEPOETIN ALFA 100 MCG/0.5ML IJ SOSY
PREFILLED_SYRINGE | INTRAMUSCULAR | Status: AC
  Filled 2015-10-06: qty 0.5

## 2015-10-06 NOTE — Progress Notes (Signed)
Subjective:  Alert, no complaints  Objective Vital signs in last 24 hours: Filed Vitals:   10/06/15 0900 10/06/15 0913 10/06/15 0930 10/06/15 0954  BP: 166/110 198/115 195/121 187/118  Pulse: 95 94 85 88  Temp: 97.7 F (36.5 C)     TempSrc: Oral     Resp: 15 15 16 14   Height:      Weight: 86.3 kg (190 lb 4.1 oz)     SpO2: 97%      Weight change:   Physical Exam: General: alert,AAM, NAD, facial edema   Heart: RRR, no mur , rub or gallop Lungs: CTA bilat / nonlabored breathing Abdomen: soft , NT, ND Extremities: Swelling R arm  With Scattered areas of purpura arm and chest/abd  / no pedal edema Dialysis Access: R Fem Perm cath / R FA AVG  Pos bruit and purpura rash   with arm swelling   OP Dialysis[facility] 4h 15min 77.5kg 2/k 2.25 ca bath RFA AVG Hep 8000 Hect 4 ug/Venofer 50/wk Last op lab s= HGB 12.1 5/04 Ca 8.2 phos 7.7 pth 2419< 2473   Problem/Plan: 1.  SP removal infected RUE AVG - 5/15, Dr Darrick PennaFields 2.  R subclav occlusion- swelling face and arm due to this also 3. ESRD - TTS HD, now has R thigh cath. HD today 4. Recurrent hematemesis   With HO  Known - ulcer and grade I varices HGB 9.6 5. Anemiaof ESRD and GI Bld - montior hgb weekly Fe/ start Aranesp 60 with next hd  6. Metabolic bone disease - cotninue current meds, hectorol -iPTH 2400 - uncontrolled;- on aurixya as outpt - change to velphoro here Ca 9.1 phos 8.6  Alb 3.3 7. Nutrition- changed to regular diet per wife's request in Past he lest AMA Because he had RENAL diet 8. HIV- meds per primary 9. Volume - 3-4 kg up, doubt accuracy of today's wt 10. Hyperkalemia - unclear cause , but > 7.5 today.  May need another HD tomorrow.  Will put in orders   Vinson Moselleob Dawn Kiper MD [facility] pager [phone]    cell [phone] 10/06/2015, 10:30 AM    Labs: Basic Metabolic Panel:  Recent Labs Lab 10/03/15 0541 10/05/15 0446 10/06/15 0930  NA 140 135 135  K 4.3 4.8 >7.5*  CL 97* 97*  96*  CO2 18* 18* 17*  GLUCOSE 77 79 85  BUN 96* 76* 81*  CREATININE 27.81* 24.81* 26.91*  CALCIUM 8.1* 7.9* 7.3*  PHOS  --  8.6* 11.5*   Liver Function Tests:  Recent Labs Lab 09/29/15 1230 09/30/15 1718 10/05/15 0446 10/06/15 0930  AST 23 21  --   --   ALT 14* 14*  --   --   ALKPHOS 235* 229*  --   --   BILITOT 0.8 0.8  --   --   PROT 8.3* 7.7  --   --   ALBUMIN 3.8 3.6 3.3* 3.1*   No results for input(s): LIPASE, AMYLASE in the last 168 hours. No results for input(s): AMMONIA in the last 168 hours. CBC:  Recent Labs Lab 09/29/15 1230 09/30/15 1718 10/01/15 0727 10/03/15 0541 10/05/15 0446 10/06/15 0930  WBC 6.6 6.5 4.7 4.8 4.7 6.5  NEUTROABS 4.5  --   --   --   --   --   HGB 11.7* 10.6* 11.2* 10.5* 9.6* 9.4*  HCT 35.4* 32.4* 33.3* 31.0* 28.9* 28.8*  MCV 89.6 88.3 90.5 89.6 88.9 89.2  PLT 177 159 176 161  122* 125*   Cardiac Enzymes: No results for input(s): CKTOTAL, CKMB, CKMBINDEX, TROPONINI in the last 168 hours. CBG: No results for input(s): GLUCAP in the last 168 hours.  Studies/Results: No results found. Medications: . sodium chloride 35 mL/hr (10/05/15 1351)   . busPIRone  10 mg Oral BID  . cinacalcet  120 mg Oral Q breakfast  . darbepoetin (ARANESP) injection - DIALYSIS  100 mcg Intravenous Q Tue-HD  . doxercalciferol  4 mcg Intravenous Q T,Th,Sa-HD  . efavirenz  600 mg Oral QHS  . ferric gluconate (FERRLECIT/NULECIT) IV  62.5 mg Intravenous Q Thu-HD  . lamiVUDine  150 mg Oral Once per day on Sun Thu  . levETIRAcetam  500 mg Oral BID  . methylPREDNISolone  4 mg Oral 4X daily taper  . multivitamins with iron  1 tablet Oral Daily  . mupirocin ointment   Nasal BID  . oxyCODONE      . pantoprazole (PROTONIX) IV  40 mg Intravenous Q12H  . piperacillin-tazobactam (ZOSYN)  IV  2.25 g Intravenous Q8H  . tenofovir  300 mg Oral Q Sun  . vancomycin  750 mg Intravenous Q T,Th,Sa-HD

## 2015-10-06 NOTE — Progress Notes (Signed)
Pt. Asleep. Pt. Requested to wait on giving the medrol. Moved med to 2000. Will report to night Rn.

## 2015-10-06 NOTE — Progress Notes (Signed)
Pt. Being transported to dialysis by [facility].

## 2015-10-06 NOTE — Progress Notes (Signed)
Called down to pharmacy after pt. Got back from dialysis to double check on medication times. Instructed that giving 10:00 AM  meds at 3:00 PM was okay and was also instructed to hold on 8:00AM and 1:00 PM  Doses of medrol and just start with the 6:00 PM  Dose of medrol.

## 2015-10-06 NOTE — Progress Notes (Signed)
[facility]  Subjective  - feels ok, chest cramping on dialysis, no real arm pain   Objective 163/95 86 97.7 F (36.5 C) (Oral) 13 96%  Intake/Output Summary (Last 24 hours) at 10/06/15 1325 Last data filed at 10/06/15 1304  Gross per 24 hour  Intake    500 ml  Output   2629 ml  Net  -2129 ml   Dressing and ACE intact right hand warm  Assessment/Planning: Follow up cultures Change dressing tomorrow. Pt ok for d/c from our standpoint  Fabienne BrunsFields, Elisha Mcgruder 10/06/2015 1:25 PM --  Laboratory Lab Results:  Recent Labs  10/05/15 0446 10/06/15 0930  WBC 4.7 6.5  HGB 9.6* 9.4*  HCT 28.9* 28.8*  PLT 122* 125*   BMET  Recent Labs  10/05/15 0446 10/06/15 0930  NA 135 135  K 4.8 >7.5*  CL 97* 96*  CO2 18* 17*  GLUCOSE 79 85  BUN 76* 81*  CREATININE 24.81* 26.91*  CALCIUM 7.9* 7.3*    COAG Lab Results  Component Value Date   INR 1.09 09/22/2015   No results found for: PTT

## 2015-10-06 NOTE — Progress Notes (Deleted)
Subjective:  Alert, no complaints  Objective Vital signs in last 24 hours: Filed Vitals:   10/05/15 1728 10/05/15 1747 10/05/15 2127 10/06/15 0528  BP: 183/94 173/74 156/85 162/88  Pulse: 97 96 98 97  Temp:  97.8 F (36.6 C) 98.5 F (36.9 C) 97.6 F (36.4 C)  TempSrc:  Oral Oral Oral  Resp: 15 15 19 15   Height:      Weight:      SpO2: 97% 96% 97% 97%   Weight change:   Physical Exam: General: alert,AAM, NAD, facial edema   Heart: RRR, no mur , rub or gallop Lungs: CTA bilat / nonlabored breathing Abdomen: soft , NT, ND Extremities: Swelling R arm  With Scattered areas of purpura arm and chest/abd  / no pedal edema Dialysis Access: R Fem Perm cath / R FA AVG  Pos bruit and purpura rash   with arm swelling   [facility] 4h 15min 77.5kg 2/k 2.25 ca bath RFA AVG Hep 8000 Hect 4 ug/Venofer 50/wk Last op lab s= HGB 12.1 5/04 Ca 8.2 phos 7.7 pth 2419< 2473   Problem/Plan: 1.  SP removal infected RUE AVG - 5/15, Dr Darrick PennaFields 2.  R subclav occlusion- swelling face and arm due to this also 3. ESRD - TTS HD, now has R thigh cath. HD today 4. Recurrent hematemesis   With HO  Known - ulcer and grade I varices HGB 9.6 5. Anemiaof ESRD and GI Bld - montior hgb weekly Fe/ start Aranesp 60 with next hd  6. Metabolic bone disease - cotninue current meds, hectorol -iPTH 2400 - uncontrolled;- on aurixya as outpt - change to velphoro here Ca 9.1 phos 8.6  Alb 3.3 7. Nutrition- changed to regular diet per wife's request in Past he lest AMA Because he had RENAL diet 8. HIV- meds per primary 9. Volume - 2-3 kg up   Vinson Moselleob Harlynn Kimbell MD [facility] pager [phone]    cell [phone] 10/06/2015, 9:00 AM    Labs: Basic Metabolic Panel:  Recent Labs Lab 10/01/15 0727 10/03/15 0541 10/05/15 0446  NA 135 140 135  K 4.8 4.3 4.8  CL 91* 97* 97*  CO2 22 18* 18*  GLUCOSE 111* 77 79  BUN 78* 96* 76*  CREATININE 23.25* 27.81* 24.81*  CALCIUM 8.5* 8.1*  7.9*  PHOS  --   --  8.6*   Liver Function Tests:  Recent Labs Lab 09/29/15 1230 09/30/15 1718 10/05/15 0446  AST 23 21  --   ALT 14* 14*  --   ALKPHOS 235* 229*  --   BILITOT 0.8 0.8  --   PROT 8.3* 7.7  --   ALBUMIN 3.8 3.6 3.3*   No results for input(s): LIPASE, AMYLASE in the last 168 hours. No results for input(s): AMMONIA in the last 168 hours. CBC:  Recent Labs Lab 09/29/15 1230 09/30/15 1718 10/01/15 0727 10/03/15 0541 10/05/15 0446  WBC 6.6 6.5 4.7 4.8 4.7  NEUTROABS 4.5  --   --   --   --   HGB 11.7* 10.6* 11.2* 10.5* 9.6*  HCT 35.4* 32.4* 33.3* 31.0* 28.9*  MCV 89.6 88.3 90.5 89.6 88.9  PLT 177 159 176 161 122*   Cardiac Enzymes: No results for input(s): CKTOTAL, CKMB, CKMBINDEX, TROPONINI in the last 168 hours. CBG: No results for input(s): GLUCAP in the last 168 hours.  Studies/Results: No results found. Medications: . sodium chloride 35 mL/hr (10/05/15 1351)   . busPIRone  10 mg Oral  BID  . cinacalcet  120 mg Oral Q breakfast  . darbepoetin (ARANESP) injection - DIALYSIS  100 mcg Intravenous Q Tue-HD  . doxercalciferol  4 mcg Intravenous Q T,Th,Sa-HD  . efavirenz  600 mg Oral QHS  . ferric gluconate (FERRLECIT/NULECIT) IV  62.5 mg Intravenous Q Thu-HD  . lamiVUDine  150 mg Oral Once per day on Sun Thu  . levETIRAcetam  500 mg Oral BID  . methylPREDNISolone  4 mg Oral 4X daily taper  . multivitamins with iron  1 tablet Oral Daily  . mupirocin ointment   Nasal BID  . pantoprazole (PROTONIX) IV  40 mg Intravenous Q12H  . piperacillin-tazobactam (ZOSYN)  IV  2.25 g Intravenous Q8H  . tenofovir  300 mg Oral Q Sun  . vancomycin  750 mg Intravenous Q T,Th,Sa-HD

## 2015-10-06 NOTE — Clinical Social Work Note (Signed)
Clinical Social Worker contacted by bedside RN stating that patient and pt's wife are requesting to meet with CSW. CSW met with patient with pt's wife and mother at bedside. Pt's wife reported that they recently moved from [place] and discovered that Medicare transfers state-to-state however Medicaid does NOT.   CSW explained that Medicaid is state specific and that patient will need to complete Medicaid application and provide completed document to local county DSS. CSW informed patient's wife that CSW could not assist with completing application. Pt's wife became upset. Wife provided with Medicaid applications at bedside along with instructions and list of supporting requirements she will need to collect.   Pt's wife requesting that CSW follow up with her tomorrow to review completed application.   2- [place] Medicaid applications provided at bedside to pt's wife. No further concerns reported by family at this time.  Clinical Social Worker will sign off for now as social work intervention is no longer needed. Please consult [place] again if new need arises.  Glendon Axe, MSW, LCSWA [phone] 10/06/2015 5:22 PM

## 2015-10-06 NOTE — Progress Notes (Signed)
Patient ID: Robert Bradshaw, male   DOB: 07/11/1980, 35 y.o.   MRN: [id number]   PROGRESS NOTE    Robert Bradshaw  AOZ:[id number] DOB: 02/08/1981 DOA: 09/30/2015  PCP: Jeanine Luzalone, Gregory, FNP   Outpatient Specialists:   Brief Narrative:  35 y.o. male with a past medical history significant for ESRD on HD TThS, HIV well controlled who presented with hematemesis, last EGD April 2017 with grade I varices and non bleeding ulcer. He missed last HD on Tuesday.   Major events since admission: 5/14 - started vanc and zosyn as R forearm AV graft infected   Assessment & Plan:  Hematemesis in setting of known ulcer and grade I varices - resolved  - Hg remains stable so far  - no indication for endoscopy at this time per GI team - continue PPI  Right forearm AV graft infection  - graft removed 5/15, pt reports feeling well this AM - started vancomycin and zosyn 5/14, today is day #3 of ABX  Skin rash - scattered brownish spots in the back and chest area - medrol pack started 5/14, improving overall   HIV - HIV RNA undetect last month. Missed establishment of care appt with Dr. Daiva EvesVan Dam last week - Continue home antiretrovirals  HTN, essential  - SBP in 180's this AM - will add hydralazine as needed   ESRD on HD TThS - per nephrology team - malfunctioning right forarm graft - vascular surgery placed Fem perm cath 5/13  Hx of seizures - Continue Keppra - no seizures here   Thrombocytopenia - mild, reactive - monitor   Combination anemia of renal disease and acute blood loss anemia  - no indication for transfusion  - CBC In AM  Obesity  - Body mass index is 30.26 kg/(m^2) - requesting regular diet and double portions   R subclavian occlusion - chronic with subsequent facial edema    DVT prophylaxis: SCD's Code Status: Full  Family Communication: Patient and wife at bedside  Disposition Plan: Home when cleared by nephrology and vascular surgery team   Consultants:    Nephrology  GI  Vascular surgery   Procedures:   GI  Antimicrobials:  Rocephin 5/10 --> 5/12  Subjective: Reports feeling better, no chest pain or dyspnea.   Objective: Filed Vitals:   10/06/15 0900 10/06/15 0913 10/06/15 0930 10/06/15 0954  BP: 166/110 198/115 195/121 187/118  Pulse: 95 94 85 88  Temp: 97.7 F (36.5 C)     TempSrc: Oral     Resp: 15 15 16 14   Height:      Weight: 86.3 kg (190 lb 4.1 oz)     SpO2: 97%       Intake/Output Summary (Last 24 hours) at 10/06/15 0956 Last data filed at 10/05/15 1617  Gross per 24 hour  Intake    500 ml  Output    100 ml  Net    400 ml   Examination:  General exam: Appears comfortable, purpura scattered in chest and back area, improving  Respiratory system: Clear to auscultation. Respiratory effort normal. Cardiovascular system: S1 & S2 heard, RRR. No JVD, rubs, gallops or clicks. No pedal edema. Gastrointestinal system: Abdomen is nondistended, soft and nontender.   Data Reviewed: I have personally reviewed following labs and imaging studies  CBC:  Recent Labs Lab 09/29/15 1230 09/30/15 1718 10/01/15 0727 10/03/15 0541 10/05/15 0446 10/06/15 0930  WBC 6.6 6.5 4.7 4.8 4.7 6.5  NEUTROABS 4.5  --   --   --   --   --  HGB 11.7* 10.6* 11.2* 10.5* 9.6* 9.4*  HCT 35.4* 32.4* 33.3* 31.0* 28.9* 28.8*  MCV 89.6 88.3 90.5 89.6 88.9 89.2  PLT 177 159 176 161 122* 125*   Basic Metabolic Panel:  Recent Labs Lab 09/29/15 1230 09/30/15 1718 10/01/15 0727 10/03/15 0541 10/05/15 0446  NA 140 138 135 140 135  K 4.2 3.9 4.8 4.3 4.8  CL 93* 93* 91* 97* 97*  CO2 18* 18*  GLUCOSE 89 74 111* 77 79  BUN 65* 74* 78* 96* 76*  CREATININE 18.96* 21.76* 23.25* 27.81* 24.81*  CALCIUM 8.6* 9.1 8.5* 8.1* 7.9*  PHOS  --   --   --   --  8.6*   Liver Function Tests:  Recent Labs Lab 09/29/15 1230 09/30/15 1718 10/05/15 0446  AST 23 21  --   ALT 14* 14*  --   ALKPHOS 235* 229*  --   BILITOT 0.8 0.8  --    PROT 8.3* 7.7  --   ALBUMIN 3.8 3.6 3.3*   Recent Results (from the past 240 hour(s))  MRSA PCR Screening     Status: None   Collection Time: 09/23/15  2:30 AM  Result Value Ref Range Status   MRSA by PCR NEGATIVE NEGATIVE Final    Radiology Studies: X-ray Chest Pa And Lateral 09/30/2015  No active cardiopulmonary disease.    Scheduled Meds: . busPIRone  10 mg Oral BID  . cinacalcet  120 mg Oral Q breakfast  . darbepoetin (ARANESP) injection - DIALYSIS  100 mcg Intravenous Q Tue-HD  . doxercalciferol  4 mcg Intravenous Q T,Th,Sa-HD  . efavirenz  600 mg Oral QHS  . ferric gluconate (FERRLECIT/NULECIT) IV  62.5 mg Intravenous Q Thu-HD  . lamiVUDine  150 mg Oral Once per day on Sun Thu  . levETIRAcetam  500 mg Oral BID  . methylPREDNISolone  4 mg Oral 4X daily taper  . multivitamins with iron  1 tablet Oral Daily  . mupirocin ointment   Nasal BID  . oxyCODONE      . pantoprazole (PROTONIX) IV  40 mg Intravenous Q12H  . piperacillin-tazobactam (ZOSYN)  IV  2.25 g Intravenous Q8H  . tenofovir  300 mg Oral Q Sun  . vancomycin  750 mg Intravenous Q T,Th,Sa-HD   Continuous Infusions: . sodium chloride 35 mL/hr (10/05/15 1351)   Time spent: 20 minutes   Debbora Presto, MD [facility] Pager [phone]  If 7PM-7AM, please contact night-coverage [email or web address] Password [facility] 10/06/2015, 9:56 AM

## 2015-10-07 LAB — BASIC METABOLIC PANEL
Anion gap: 20 — ABNORMAL HIGH (ref 5–15)
BUN: 41 mg/dL — AB (ref 6–20)
CALCIUM: 7.8 mg/dL — AB (ref 8.9–10.3)
CHLORIDE: 97 mmol/L — AB (ref 101–111)
CO2: 20 mmol/L — ABNORMAL LOW (ref 22–32)
CREATININE: 17.41 mg/dL — AB (ref 0.61–1.24)
GFR calc non Af Amer: 3 mL/min — ABNORMAL LOW (ref >60)
GFR, EST AFRICAN AMERICAN: 3 mL/min — AB (ref >60)
Glucose, Bld: 151 mg/dL — ABNORMAL HIGH (ref 65–99)
Potassium: 3.4 mmol/L — ABNORMAL LOW (ref 3.5–5.1)
SODIUM: 137 mmol/L (ref 135–145)

## 2015-10-07 MED ORDER — CAMPHOR-MENTHOL 0.5-0.5 % EX LOTN: TOPICAL_LOTION | CUTANEOUS | Status: DC | PRN

## 2015-10-07 MED ORDER — SODIUM CHLORIDE 0.9 % IV SOLN: 100.0000 mL | INTRAVENOUS | Status: DC | PRN

## 2015-10-07 MED ORDER — PENTAFLUOROPROP-TETRAFLUOROETH EX AERO: 1.0000 "application " | INHALATION_SPRAY | CUTANEOUS | Status: DC | PRN

## 2015-10-07 MED ORDER — LIDOCAINE HCL (PF) 1 % IJ SOLN: 5.0000 mL | INTRAMUSCULAR | Status: DC | PRN

## 2015-10-07 MED ORDER — LIDOCAINE-PRILOCAINE 2.5-2.5 % EX CREA: 1.0000 "application " | TOPICAL_CREAM | CUTANEOUS | Status: DC | PRN

## 2015-10-07 MED ORDER — ALTEPLASE 2 MG IJ SOLR: 2.0000 mg | Freq: Once | INTRAMUSCULAR | Status: DC | PRN

## 2015-10-07 MED ORDER — HYDROXYZINE HCL 25 MG PO TABS: 25.0000 mg | ORAL_TABLET | Freq: Three times a day (TID) | ORAL | Status: DC | PRN

## 2015-10-07 MED ORDER — OXYCODONE HCL 5 MG PO TABS: 5.0000 mg | ORAL_TABLET | ORAL | Status: DC | PRN

## 2015-10-07 MED ORDER — HEPARIN SODIUM (PORCINE) 1000 UNIT/ML DIALYSIS: 1000.0000 [IU] | INTRAMUSCULAR | Status: DC | PRN

## 2015-10-07 MED ORDER — WHITE PETROLATUM GEL
Status: AC
  Administered 2015-10-07: 0.2
  Filled 2015-10-07: qty 1

## 2015-10-07 NOTE — Consult Note (Signed)
Reason for Consult:left ear bleeding Referring Physician: hospitalist  Robert Bradshaw is an 35 y.o. male.  HPI: hx of use of a qtip last night in the left ear and it started bleeding. No hearing loss. It has continued to bleed small amount through the day. No pain. No vertigo. He did the same thing about week ago in the right ear.   Past Medical History  Diagnosis Date  . Renal disorder   . Hypertension   . Immune deficiency disorder ([place])   . HIV (human immunodeficiency virus infection) ([place])   . GERD (gastroesophageal reflux disease)   . Migraines   . Parathyroid abnormality [facility])     Past Surgical History  Procedure Laterality Date  . Av fistula placement    . Esophagogastroduodenoscopy (egd) with propofol N/A 09/23/2015    Procedure: ESOPHAGOGASTRODUODENOSCOPY (EGD) WITH PROPOFOL;  Surgeon: Wonda Horner, MD;  Location: [facility] ENDOSCOPY;  Service: Endoscopy;  Laterality: N/A;  . Insertion of dialysis catheter N/A 10/03/2015    Procedure: INSERTION OF DIALYSIS CATHETER RIGHT FEMORAL VEIN;  Surgeon: Elam Dutch, MD;  Location: [place];  Service: Vascular;  Laterality: N/A;  . Avgg removal Right 10/05/2015    Procedure: REMOVAL OF ARTERIOVENOUS GORETEX GRAFT ([place]) RIGHT FOREARM;  Surgeon: Elam Dutch, MD;  Location: [facility] OR;  Service: Vascular;  Laterality: Right;    Family History  Problem Relation Age of Onset  . Hypertension Mother   . Diabetes Mother   . Hypertension Maternal Grandfather     Social History:  reports that he has been smoking Cigarettes.  He has a 10 pack-year smoking history. He does not have any smokeless tobacco history on file. He reports that he uses illicit drugs (Marijuana) about 7 times per week. He reports that he does not drink alcohol.  Allergies: No Known Allergies  Medications: I have reviewed the patient's current medications.  Results for orders placed or performed during the hospital encounter of 09/30/15 (from the past 48 hour(s))  CBC      Status: Abnormal   Collection Time: 10/06/15  9:30 AM  Result Value Ref Range   WBC 6.5 4.0 - 10.5 K/uL   RBC 3.23 (L) 4.22 - 5.81 MIL/uL   Hemoglobin 9.4 (L) 13.0 - 17.0 g/dL   HCT 28.8 (L) 39.0 - 52.0 %   MCV 89.2 78.0 - 100.0 fL   MCH 29.1 26.0 - 34.0 pg   MCHC 32.6 30.0 - 36.0 g/dL   RDW 16.4 (H) 11.5 - 15.5 %   Platelets 125 (L) 150 - 400 K/uL  Renal function panel     Status: Abnormal   Collection Time: 10/06/15  9:30 AM  Result Value Ref Range   Sodium 135 135 - 145 mmol/L   Potassium >7.5 (HH) 3.5 - 5.1 mmol/L    Comment: NO VISIBLE HEMOLYSIS CRITICAL RESULT CALLED TO, READ BACK BY AND VERIFIED WITH: D.JAVIER,RN 10/06/15 1026 BY BSLADE    Chloride 96 (L) 101 - 111 mmol/L   CO2 17 (L) 22 - 32 mmol/L   Glucose, Bld 85 65 - 99 mg/dL   BUN 81 (H) 6 - 20 mg/dL   Creatinine, Ser 26.91 (H) 0.61 - 1.24 mg/dL    Comment: RESULTS CONFIRMED BY MANUAL DILUTION   Calcium 7.3 (L) 8.9 - 10.3 mg/dL   Phosphorus 11.5 (H) 2.5 - 4.6 mg/dL   Albumin 3.1 (L) 3.5 - 5.0 g/dL   GFR calc non Af Amer 2 (L) >60 mL/min   GFR  calc Af Amer 2 (L) >60 mL/min    Comment: (NOTE) The eGFR has been calculated using the CKD EPI equation. This calculation has not been validated in all clinical situations. eGFR's persistently <60 mL/min signify possible Chronic Kidney Disease.    Anion gap 22 (H) 5 - 15  Basic metabolic panel     Status: Abnormal   Collection Time: 10/07/15  9:53 AM  Result Value Ref Range   Sodium 137 135 - 145 mmol/L   Potassium 3.4 (L) 3.5 - 5.1 mmol/L    Comment: DELTA CHECK NOTED NO VISIBLE HEMOLYSIS    Chloride 97 (L) 101 - 111 mmol/L   CO2 20 (L) 22 - 32 mmol/L   Glucose, Bld 151 (H) 65 - 99 mg/dL   BUN 41 (H) 6 - 20 mg/dL   Creatinine, Ser 17.41 (H) 0.61 - 1.24 mg/dL    Comment: DELTA CHECK NOTED   Calcium 7.8 (L) 8.9 - 10.3 mg/dL   GFR calc non Af Amer 3 (L) >60 mL/min   GFR calc Af Amer 3 (L) >60 mL/min    Comment: (NOTE) The eGFR has been calculated using the CKD  EPI equation. This calculation has not been validated in all clinical situations. eGFR's persistently <60 mL/min signify possible Chronic Kidney Disease.    Anion gap 20 (H) 5 - 15    No results found.  ROS Blood pressure 132/82, pulse 90, temperature 98.3 F (36.8 C), temperature source Oral, resp. rate 20, height 5' 4"  (1.626 m), weight 83.6 kg (184 lb 4.9 oz), SpO2 100 %. Physical Exam  Constitutional: He appears well-developed and well-nourished.  HENT:  Head: Normocephalic and atraumatic.  Right Ear: External ear normal.  Nose: Nose normal.  Mouth/Throat: Oropharynx is clear and moist.  Left ear with blood in the canal and no skin flap the blood in inferior. The tm looks intact and middle ear aerated.  Eyes: Pupils are equal, round, and reactive to light.  Neck: Neck supple.    Assessment/Plan: Left canal injury- he has no trauma to the TM and has normal hearing and no vertigo. The canal injury should heal without issues and Ciprodex drops 4 drops left ear BID will help. Follow up in office in 1-2 weeks sooner if continued bleeding. No qtip use.   Melissa Montane 10/07/2015, 4:32 PM

## 2015-10-07 NOTE — Progress Notes (Signed)
Subjective:  Alert, no complaints  Objective Vital signs in last 24 hours: Filed Vitals:   10/06/15 1304 10/06/15 2100 10/07/15 0435 10/07/15 1425  BP: 163/95 152/91 162/77 132/82  Pulse: 86 104 92 90  Temp: 97.7 F (36.5 C) 98.5 F (36.9 C) 98.6 F (37 C) 98.3 F (36.8 C)  TempSrc: Oral Oral Oral Oral  Resp: Height:      Weight: 83.6 kg (184 lb 4.9 oz)     SpO2: 96% 100% 100% 100%   Weight change:   Physical Exam: General: alert,AAM, NAD, facial edema   Heart: RRR, no mur , rub or gallop Lungs: CTA bilat / nonlabored breathing Abdomen: soft , NT, ND Extremities: Swelling R arm  With Scattered areas of purpura arm and chest/abd  / no pedal edema Dialysis Access: R Fem Perm cath / R FA AVG  Pos bruit and purpura rash   with arm swelling   OP Dialysis: [facility]TS 4h 77.5kg 2/2.25 bath RFA AVG (removed, now has R thigh HD cath) Hep 8000 Hect 4 ug/Venofer 50/wk Last op lab s= HGB 12.1 5/04 Ca 8.2 phos 7.7 pth 2419< 2473   Problem/Plan: 1.  SP removal infected RUE AVG - 5/15, Dr Darrick Penna 2.  R subclav occlusion- swelling face and arm due to this also 3. ESRD - TTS HD. AVG removed, now has R thigh cath. HD tomorrow 4. Recurrent hematemesis   With HO  Known - ulcer and grade I varices HGB 9.6 5. Anemiaof ESRD and GI Bld - montior hgb weekly Fe/ start Aranesp 60 with next hd  6. Metabolic bone disease - cotninue current meds, hectorol -iPTH 2400 - uncontrolled;- on aurixya as outpt - change to velphoro here Ca 9.1 phos 8.6  Alb 3.3 7. Nutrition- changed to regular diet per wife's request in Past he lest AMA Because he had RENAL diet 8. HIV- meds per primary 9. Volume - 3-4 kg up, doubt accuracy of today's wt 10. Hyperkalemia - resolved, K 3.4 today  Probs -  HD tomorrow    Vinson Moselle MD [facility] pager [phone]    cell [phone] 10/07/2015, 4:43 PM    Labs: Basic Metabolic Panel:  Recent Labs Lab 10/05/15 0446  10/06/15 0930 10/07/15 0953  NA 135 135 137  K 4.8 >7.5* 3.4*  CL 97* 96* 97*  CO2 18* 17* 20*  GLUCOSE 79 85 151*  BUN 76* 81* 41*  CREATININE 24.81* 26.91* 17.41*  CALCIUM 7.9* 7.3* 7.8*  PHOS 8.6* 11.5*  --    Liver Function Tests:  Recent Labs Lab 09/30/15 1718 10/05/15 0446 10/06/15 0930  AST 21  --   --   ALT 14*  --   --   ALKPHOS 229*  --   --   BILITOT 0.8  --   --   PROT 7.7  --   --   ALBUMIN 3.6 3.3* 3.1*   No results for input(s): LIPASE, AMYLASE in the last 168 hours. No results for input(s): AMMONIA in the last 168 hours. CBC:  Recent Labs Lab 09/30/15 1718 10/01/15 0727 10/03/15 0541 10/05/15 0446 10/06/15 0930  WBC 6.5 4.7 4.8 4.7 6.5  HGB 10.6* 11.2* 10.5* 9.6* 9.4*  HCT 32.4* 33.3* 31.0* 28.9* 28.8*  MCV 88.3 90.5 89.6 88.9 89.2  PLT 159 176 161 122* 125*   Cardiac Enzymes: No results for input(s): CKTOTAL, CKMB, CKMBINDEX, TROPONINI in the last 168 hours. CBG: No results for input(s):  GLUCAP in the last 168 hours.  Studies/Results: No results found. Medications: . sodium chloride 10 mL/hr at 10/07/15 0856   . busPIRone  10 mg Oral BID  . cinacalcet  120 mg Oral Q breakfast  . darbepoetin (ARANESP) injection - DIALYSIS  100 mcg Intravenous Q Tue-HD  . doxercalciferol  4 mcg Intravenous Q T,Th,Sa-HD  . efavirenz  600 mg Oral QHS  . ferric gluconate (FERRLECIT/NULECIT) IV  62.5 mg Intravenous Q Thu-HD  . lamiVUDine  150 mg Oral Once per day on Sun Thu  . levETIRAcetam  500 mg Oral BID  . multivitamins with iron  1 tablet Oral Daily  . mupirocin ointment   Nasal BID  . pantoprazole (PROTONIX) IV  40 mg Intravenous Q12H  . piperacillin-tazobactam (ZOSYN)  IV  2.25 g Intravenous Q8H  . tenofovir  300 mg Oral Q Sun  . vancomycin  750 mg Intravenous Q T,Th,Sa-HD

## 2015-10-07 NOTE — Progress Notes (Addendum)
[facility] Progress Note  Subjective  - POD #2  Right arm less sore, but still painful.   Objective Filed Vitals:   10/06/15 2100 10/07/15 0435  BP: 152/91 162/77  Pulse: 104 92  Temp: 98.5 F (36.9 C) 98.6 F (37 C)  Resp: 17 18    Intake/Output Summary (Last 24 hours) at 10/07/15 1117 Last data filed at 10/06/15 1856  Gross per 24 hour  Intake    360 ml  Output   2529 ml  Net  -2169 ml   Right forearm with staples intact. Very slow dark blood oozing from lateral incision. No purulence. Swelling of right forearm.  Palpable right radial pulse   Assessment/Planning: 35 y.o. male is s/p: removal of infected forearm AV graft 2 Days Post-Op   Wound cultures pending.  Incisions clean.  Needs continued compression with ACE wrap to right arm.  Ok to d/c from vascular standpoint. F/u in 2 weeks with Dr. Darrick Penna.   Raymond Gurney 10/07/2015 11:17 AM -- Agree will sign off F/u 2 weeks  Fabienne Bruns, MD [facility] [place] Office: [phone] Pager: [phone]  Laboratory CBC    Component Value Date/Time   WBC 6.5 10/06/2015 0930   HGB 9.4* 10/06/2015 0930   HCT 28.8* 10/06/2015 0930   PLT 125* 10/06/2015 0930    BMET    Component Value Date/Time   NA 137 10/07/2015 0953   K 3.4* 10/07/2015 0953   CL 97* 10/07/2015 0953   CO2 20* 10/07/2015 0953   GLUCOSE 151* 10/07/2015 0953   BUN 41* 10/07/2015 0953   CREATININE 17.41* 10/07/2015 0953   CREATININE 12.15* 09/09/2015 0919   CALCIUM 7.8* 10/07/2015 0953   GFRNONAA 3* 10/07/2015 0953   GFRNONAA 5* 09/09/2015 0919   GFRAA 3* 10/07/2015 0953   GFRAA 5* 09/09/2015 0919    COAG Lab Results  Component Value Date   INR 1.09 09/22/2015   No results found for: PTT  Antibiotics Anti-infectives    Start     Dose/Rate Route Frequency Ordered Stop   10/06/15 1200  vancomycin (VANCOCIN) IVPB 750 mg/150 ml premix     750 mg 150 mL/hr over 60 Minutes  Intravenous Every T-Th-Sa (Hemodialysis) 10/04/15 1022     10/05/15 2200  tenofovir (VIREAD) tablet 300 mg     300 mg Oral Every Sun 10/05/15 0024     10/05/15 2200  lamiVUDine (EPIVIR) tablet 150 mg     150 mg Oral Once per day on Sun Thu 10/05/15 0025     10/05/15 1345  piperacillin-tazobactam (ZOSYN) IVPB 2.25 g     2.25 g 100 mL/hr over 30 Minutes Intravenous To ShortStay Procedural 10/05/15 1341 10/05/15 1510   10/05/15 0030  lamiVUDine (EPIVIR) tablet 150 mg  Status:  Discontinued     150 mg Oral Once per day on Sun Thu 10/05/15 0010 10/05/15 0025   10/05/15 0030  tenofovir (VIREAD) tablet 300 mg  Status:  Discontinued     300 mg Oral Every Sun 10/05/15 0011 10/05/15 0024   10/04/15 2000  piperacillin-tazobactam (ZOSYN) IVPB 2.25 g     2.25 g 100 mL/hr over 30 Minutes Intravenous Every 8 hours 10/04/15 1327     10/04/15 1800  piperacillin-tazobactam (ZOSYN) IVPB 2.25 g  Status:  Discontinued     2.25 g 100 mL/hr over 30 Minutes Intravenous Every 8 hours 10/04/15 1022 10/04/15 1327   10/04/15 1100  vancomycin (VANCOCIN) 1,500 mg in sodium chloride  0.9 % 500 mL IVPB     1,500 mg 250 mL/hr over 120 Minutes Intravenous  Once 10/04/15 1016 10/04/15 1631   10/04/15 1030  piperacillin-tazobactam (ZOSYN) IVPB 3.375 g     3.375 g 100 mL/hr over 30 Minutes Intravenous  Once 10/04/15 1017 10/04/15 1344   10/01/15 2200  lamiVUDine (EPIVIR) tablet 150 mg  Status:  Discontinued     150 mg Oral Once per day on Mon Thu 09/30/15 2111 10/05/15 0010   10/01/15 2200  tenofovir (VIREAD) tablet 300 mg  Status:  Discontinued     300 mg Oral Weekly 09/30/15 2111 10/05/15 0011   09/30/15 2200  efavirenz (SUSTIVA) tablet 600 mg     600 mg Oral Daily at bedtime 09/30/15 2111     09/30/15 2200  cefTRIAXone (ROCEPHIN) 1 g in dextrose 5 % 50 mL IVPB  Status:  Discontinued     1 g 100 mL/hr over 30 Minutes Intravenous Every 24 hours 09/30/15 2111 10/04/15 1018       Maris BergerKimberly Trinh, PA-C [facility] Office: [phone] Pager: [phone] 10/07/2015 11:17 AM

## 2015-10-07 NOTE — Progress Notes (Signed)
Wife cleaned patient's left ear with Q tips that caused it to bleed, placed gauze  In left ear. Will monitor.

## 2015-10-07 NOTE — Discharge Instructions (Signed)
End-Stage Kidney Disease The kidneys are two organs that lie on either side of the spine between the middle of the back and the front of the abdomen. The kidneys:   Remove wastes and extra water from the blood.   Produce important hormones. These help keep bones strong, regulate blood pressure, and help create red blood cells.   Balance the fluids and chemicals in the blood and tissues. End-stage kidney disease occurs when the kidneys are so damaged that they cannot do their job. When the kidneys cannot do their job, life-threatening problems occur. The body cannot stay clean and strong without the help of the kidneys. In end-stage kidney disease, the kidneys cannot get better.You need a new kidney or treatments to do some of the work healthy kidneys do in order to stay alive. CAUSES  End-stage kidney disease usually occurs when a long-lasting (chronic) kidney disease gets worse. It may also occur after the kidneys are suddenly damaged (acute kidney injury).  SYMPTOMS   Swelling (edema) of the legs, ankles, or feet.   Tiredness (lethargy).   Nausea or vomiting.   Confusion.   Problems with urination, such as:   Decreased urine production.   Frequent urination, especially at night.   Frequent accidents in children who are potty trained.   Muscle twitches and cramps.   Persistent itchiness.   Loss of appetite.   Headaches.   Abnormally dark or light skin.   Numbness in the hands or feet.   Easy bruising.   Frequent hiccups.   Menstruation stops. DIAGNOSIS  Your health care provider will measure your blood pressure and take some tests. These may include:   Urine tests.   Blood tests.   Imaging tests, such as:   An ultrasound exam.   Computed tomography (CT).  A kidney biopsy. TREATMENT  There are two treatments for end-stage kidney disease:   A procedure that removes toxic wastes from the body (dialysis).   Receiving a new kidney  (kidney transplant). Both of these treatments have serious risks and consequences. Your health care provider will help you determine which treatment is best for you based on your health, age, and other factors. In addition to having dialysis or a kidney transplant, you may need to take medicines to control high blood pressure (hypertension) and cholesterol and to decrease phosphorus levels in your blood.  HOME CARE INSTRUCTIONS  Follow your prescribed diet.   Take medicines only as directed by your health care provider.   Do not take any new medicines (prescription, over-the-counter, or nutritional supplements) unless approved by your health care provider. Many medicines can worsen your kidney damage or need to have the dose adjusted.   Keep all follow-up visits as directed by your health care provider. MAKE SURE YOU:  Understand these instructions.  Will watch your condition.  Will get help right away if you are not doing well or get worse.   This information is not intended to replace advice given to you by your health care provider. Make sure you discuss any questions you have with your health care provider.   Document Released: 07/30/2003 Document Revised: 05/30/2014 Document Reviewed: 01/06/2012 Elsevier Interactive Patient Education 2016 [employer]  

## 2015-10-07 NOTE — Progress Notes (Signed)
Pharmacy Antibiotic Note  Robert BernheimJoe Bradshaw is a 35 y.o. male admitted on 09/30/2015 with wound infection.  Pharmacy has been consulted for Zosyn and vancomycin dosing. PMH includes ESRD with HD-TTS.  Day #4 of abx for wound infection. Most likely has infection of R forearm AV graft. S/p removal of infected  RUE AV graft in OR done on 10/05/15. Afebrile, WBC wnl.  Plan: Continue Zosyn 2.25g IV Q8 Continue vancomycin 750mg  IV QHD-TTS Monitor clinical picture, HD sessions F/U C&S, abx deescalation / LOT   Height: 5\' 4"  (162.6 cm) Weight: 184 lb 4.9 oz (83.6 kg) IBW/kg (Calculated) : 59.2  Temp (24hrs), Avg:98.3 F (36.8 C), Min:97.7 F (36.5 C), Max:98.6 F (37 C)   Recent Labs Lab 09/30/15 1718 10/01/15 0727 10/03/15 0541 10/05/15 0446 10/06/15 0930  WBC 6.5 4.7 4.8 4.7 6.5  CREATININE 21.76* 23.25* 27.81* 24.81* 26.91*    Estimated Creatinine Clearance: 3.7 mL/min (by C-G formula based on Cr of 26.91).    No Known Allergies  Antimicrobials this admission: Zosyn 5/14 >>  Vancomycin 5/14 >>   Dose adjustments this admission: n/a  Microbiology results: 5/15: MRSA PCR negative 5/15: Wound Cx: pending 5/15 Surgical PCR: MSSA  Thank you for allowing pharmacy to be a part of this patient's care.  Robert Bradshaw, PharmD, BCPS Clinical Pharmacist Pager [phone] 10/07/2015 9:31 AM

## 2015-10-07 NOTE — Progress Notes (Signed)
Patient ID: Robert Bradshaw, male   DOB: April 30, 1981, 35 y.o.   MRN: [id number]   PROGRESS NOTE    Robert Bradshaw  EPP:[id number] DOB: January 14, 1981 DOA: 09/30/2015  PCP: Jeanine Luz, FNP   Outpatient Specialists:   Brief Narrative:  35 y.o. male with a past medical history significant for ESRD on HD TThS, HIV well controlled who presented with hematemesis, last EGD April 2017 with grade I varices and non bleeding ulcer. He missed last HD on Tuesday.   Major events since admission: 5/14 - started vanc and zosyn as R forearm AV graft infected   Assessment & Plan:  Hematemesis in setting of known ulcer and grade I varices - resolved  - Hg remains stable so far  - no indication for endoscopy at this time per GI team - continue PPI  Ear bleed - after wife tried to clean ears, pushed too deep - ENT consulted for assistance   Right forearm AV graft infection  - graft removed 5/15, pt reports feeling well this AM - started vancomycin and zosyn 5/14, today is day #4 of ABX\ - ? If OK to stop per vascular team   Skin rash - scattered brownish spots in the back and chest area - medrol pack started 5/14, improving overall  - wife wants this to be discontinued, said pt was hallucinating   HIV - HIV RNA undetect last month. Missed establishment of care appt with Dr. Daiva Eves last week - Continue home antiretrovirals  HTN, essential  - stable this AM   ESRD on HD TThS - per nephrology team - malfunctioning right forarm graft - vascular surgery placed Fem perm cath 5/13 - pt refused HD today, unclear reason   Hx of seizures - Continue Keppra - no seizures here   Thrombocytopenia - mild, reactive - monitor   Combination anemia of renal disease and acute blood loss anemia  - no indication for transfusion  - CBC In AM  Obesity  - Body mass index is 30.26 kg/(m^2) - requesting regular diet and double portions   R subclavian occlusion - chronic with subsequent facial edema     DVT prophylaxis: SCD's Code Status: Full  Family Communication: Patient and wife at bedside  Disposition Plan: Home when cleared by nephrology  Consultants:   Nephrology  GI  Vascular surgery   ENT  Procedures:   GI  Subjective: Reports feeling better, no chest pain or dyspnea.   Objective: Filed Vitals:   10/06/15 1230 10/06/15 1304 10/06/15 2100 10/07/15 0435  BP: 151/86 163/95 152/91 162/77  Pulse: 92 86 104 92  Temp:  97.7 F (36.5 C) 98.5 F (36.9 C) 98.6 F (37 C)  TempSrc:  Oral Oral Oral  Resp:  Height:      Weight:  83.6 kg (184 lb 4.9 oz)    SpO2:  96% 100% 100%    Intake/Output Summary (Last 24 hours) at 10/07/15 6063 Last data filed at 10/06/15 1856  Gross per 24 hour  Intake    360 ml  Output   2529 ml  Net  -2169 ml   Examination:  General exam: Appears comfortable, purpura scattered in chest and back area, improving  Respiratory system: Clear to auscultation. Respiratory effort normal. Cardiovascular system: S1 & S2 heard, RRR. No JVD, rubs, gallops or clicks. No pedal edema. Gastrointestinal system: Abdomen is nondistended, soft and nontender.   Data Reviewed: I have personally reviewed following labs and imaging studies  CBC:  Recent Labs Lab 09/30/15 1718 10/01/15 0727 10/03/15 0541 10/05/15 0446 10/06/15 0930  WBC 6.5 4.7 4.8 4.7 6.5  HGB 10.6* 11.2* 10.5* 9.6* 9.4*  HCT 32.4* 33.3* 31.0* 28.9* 28.8*  MCV 88.3 90.5 89.6 88.9 89.2  PLT 159 176 161 122* 125*   Basic Metabolic Panel:  Recent Labs Lab 09/30/15 1718 10/01/15 0727 10/03/15 0541 10/05/15 0446 10/06/15 0930  NA 138 135 140 135 135  K 3.9 4.8 4.3 4.8 >7.5*  CL 93* 91* 97* 97* 96*  CO2 23 22 18* 18* 17*  GLUCOSE 74 111* 77 79 85  BUN 74* 78* 96* 76* 81*  CREATININE 21.76* 23.25* 27.81* 24.81* 26.91*  CALCIUM 9.1 8.5* 8.1* 7.9* 7.3*  PHOS  --   --   --  8.6* 11.5*   Liver Function Tests:  Recent Labs Lab 09/30/15 1718 10/05/15 0446  10/06/15 0930  AST 21  --   --   ALT 14*  --   --   ALKPHOS 229*  --   --   BILITOT 0.8  --   --   PROT 7.7  --   --   ALBUMIN 3.6 3.3* 3.1*   Recent Results (from the past 240 hour(s))  MRSA PCR Screening     Status: None   Collection Time: 09/23/15  2:30 AM  Result Value Ref Range Status   MRSA by PCR NEGATIVE NEGATIVE Final    Radiology Studies: X-ray Chest Pa And Lateral 09/30/2015  No active cardiopulmonary disease.    Scheduled Meds: . busPIRone  10 mg Oral BID  . cinacalcet  120 mg Oral Q breakfast  . darbepoetin (ARANESP) injection - DIALYSIS  100 mcg Intravenous Q Tue-HD  . doxercalciferol  4 mcg Intravenous Q T,Th,Sa-HD  . efavirenz  600 mg Oral QHS  . ferric gluconate (FERRLECIT/NULECIT) IV  62.5 mg Intravenous Q Thu-HD  . lamiVUDine  150 mg Oral Once per day on Sun Thu  . levETIRAcetam  500 mg Oral BID  . methylPREDNISolone  4 mg Oral 4X daily taper  . multivitamins with iron  1 tablet Oral Daily  . mupirocin ointment   Nasal BID  . pantoprazole (PROTONIX) IV  40 mg Intravenous Q12H  . piperacillin-tazobactam (ZOSYN)  IV  2.25 g Intravenous Q8H  . tenofovir  300 mg Oral Q Sun  . vancomycin  750 mg Intravenous Q T,Th,Sa-HD   Continuous Infusions: . sodium chloride 35 mL/hr (10/05/15 1351)   Time spent: 20 minutes   Debbora PrestoMAGICK-MYERS, ISKRA, MD [facility] Pager [phone]  If 7PM-7AM, please contact night-coverage [email or web address] Password [email or web address] 10/07/2015, 6:13 AM

## 2015-10-09 LAB — WOUND CULTURE: Gram Stain: NONE SEEN

## 2015-10-10 LAB — ANAEROBIC CULTURE: GRAM STAIN: NONE SEEN

## 2015-10-14 ENCOUNTER — Encounter: Payer: Self-pay | Admitting: Vascular Surgery

## 2015-10-14 NOTE — Discharge Summary (Addendum)
Physician Discharge Summary  Kingdom Vanzanten WUJ:[id number] DOB: [birth year] DOA: 09/30/2015  PCP: Jeanine Luz, FNP  Admit date: 09/30/2015 Discharge date: 10/07/2015  Recommendations for Outpatient Follow-up:  1. Pt left AMA  Discharge Diagnoses:  Principal Problem:   Hematemesis Active Problems:   End stage renal disease (HCC)   Seizures (HCC)   HIV disease (HCC)   Encounter for central line placement   Discharge Condition: Stable per RN report  Diet recommendation: Pt has been refusing renal diet    PCP: Jeanine Luz, FNP  Outpatient Specialists:   Brief Narrative:  35 y.o. male with a past medical history significant for ESRD on HD TThS, HIV well controlled who presented with hematemesis, last EGD April 2017 with grade I varices and non bleeding ulcer. He missed last HD on Tuesday.   Major events since admission: 5/14 - started vanc and zosyn as R forearm AV graft infected   Assessment & Plan:  Hematemesis in setting of known ulcer and grade I varices - based on recent EGD there was small ulcer in the antrum of the stomach and small esophageal varices but GI doctor Dr. Evette Cristal did not think this was the source of bleeding this AM, he recommended PCCM input for hematemesis but since this resolved and pt left AMA this was cancelled  - since pt left home AMA, not sure if he is really going to take PPI as it was recommended    Ear bleed - after wife tried to clean ears, pushed too deep - ENT consulted for assistance  - pt left AMA right after seen by ENT doctor   Right forearm AV graft infection  - graft removed 5/15, pt reports feeling well this AM - started vancomycin and zosyn 5/14, today is day #4 of ABX - pt left AMA, wound cultures pending, did not have enough time to come to unit to give script, pt did not want to wait   Skin rash - scattered brownish spots in the back and chest area - medrol pack started 5/14, improving overall  - wife wants this to be  discontinued, said pt was hallucinating   HIV - HIV RNA undetect last month. Missed establishment of care appt with Dr. Daiva Eves last week - Continue home antiretrovirals  HTN, essential  - stable this AM   ESRD on HD TThS - per nephrology team - malfunctioning right forarm graft - vascular surgery placed Fem perm cath 5/13 - pt refused HD today, unclear reason   Hx of seizures - Continue Keppra - no seizures here   Thrombocytopenia - mild, reactive  Combination anemia of renal disease and acute blood loss anemia  - no indication for transfusion   Obesity  - Body mass index is 30.26 kg/(m^2) - requesting regular diet and double portions   R subclavian occlusion - chronic with subsequent facial edema   Disposition Plan: Left AMA  Consultants:   Nephrology  GI  Vascular surgery   ENT  Procedures:   GI  Discharge Exam: Filed Vitals:   10/07/15 0435 10/07/15 1425  BP: 162/77 132/82  Pulse: 92 90  Temp: 98.6 F (37 C) 98.3 F (36.8 C)  Resp: 18 20   Filed Vitals:   10/06/15 1304 10/06/15 2100 10/07/15 0435 10/07/15 1425  BP: 163/95 152/91 162/77 132/82  Pulse: 86 104 92 90  Temp: 97.7 F (36.5 C) 98.5 F (36.9 C) 98.6 F (37 C) 98.3 F (36.8 C)  TempSrc: Oral Oral Oral Oral  Resp:  13 17 18 20   Height:      Weight: 83.6 kg (184 lb 4.9 oz)     SpO2: 96% 100% 100% 100%    PE done earlier in the day but not prior to discharge as pt left AMA  Discharge Instructions     Medication List    TAKE these medications        atorvastatin 20 MG tablet  Commonly known as:  LIPITOR  Take 20 mg by mouth at bedtime.     AURYXIA 1 GM 210 MG(Fe) Tabs  Generic drug:  Ferric Citrate  Take 630 mg by mouth 3 (three) times daily with meals.     benzonatate 200 MG capsule  Commonly known as:  TESSALON  Take 200 mg by mouth 3 (three) times daily as needed for cough.     busPIRone 10 MG tablet  Commonly known as:  BUSPAR  Take 1 tablet (10 mg  total) by mouth 2 (two) times daily.     camphor-menthol lotion  Commonly known as:  SARNA  Apply topically as needed for itching.     cinacalcet 60 MG tablet  Commonly known as:  SENSIPAR  Take 120 mg by mouth at bedtime.     efavirenz 600 MG tablet  Commonly known as:  SUSTIVA  Take 1 tablet (600 mg total) by mouth at bedtime.     folic acid-vitamin b complex-vitamin c-selenium-zinc 3 MG Tabs tablet  Take 1 tablet by mouth daily.     hydrOXYzine 25 MG tablet  Commonly known as:  ATARAX/VISTARIL  Take 1 tablet (25 mg total) by mouth 3 (three) times daily as needed for itching (refractory itching).     lamiVUDine 150 MG tablet  Commonly known as:  EPIVIR  Take 1 tablet (150 mg total) by mouth 2 (two) times a week.     levETIRAcetam 500 MG tablet  Commonly known as:  KEPPRA  Take 1 tablet (500 mg total) by mouth 2 (two) times daily.     multivitamins with iron Tabs tablet  Take 1 tablet by mouth daily.     ondansetron 4 MG disintegrating tablet  Commonly known as:  ZOFRAN-ODT  Take 4 mg by mouth every 8 (eight) hours as needed for nausea or vomiting.     oxyCODONE 5 MG immediate release tablet  Commonly known as:  Oxy IR/ROXICODONE  Take 1 tablet (5 mg total) by mouth every 4 (four) hours as needed for moderate pain or severe pain.     pantoprazole 20 MG tablet  Commonly known as:  PROTONIX  Take 2 tablets (40 mg total) by mouth daily.     promethazine 25 MG tablet  Commonly known as:  PHENERGAN  Take 25 mg by mouth every 6 (six) hours as needed for nausea or vomiting.     ranitidine 150 MG tablet  Commonly known as:  ZANTAC  Take 1 tablet (150 mg total) by mouth daily.     sucralfate 1 g tablet  Commonly known as:  CARAFATE  Take 1 tablet (1 g total) by mouth 2 (two) times daily.     SUMAtriptan 100 MG tablet  Commonly known as:  IMITREX  Take 0.5-1 tablets by mouth at the onset of a headache. May repeat in 2 hours if headache persists or recurs.      tenofovir 300 MG tablet  Commonly known as:  VIREAD  Take 1 tablet (300 mg total) by mouth once a week.  Follow-up Information    Follow up with Fabienne Bruns, MD In 2 weeks.   Specialties:  Vascular Surgery, Cardiology   Why:  Our office will call you to arrange an appointment (sent)   Contact information:   [address] [place] [address] [phone]       Follow up with Jeanine Luz, FNP.   Specialty:  Family Medicine   Contact information:   [address] [place] [address] [phone]        The results of significant diagnostics from this hospitalization (including imaging, microbiology, ancillary and laboratory) are listed below for reference.     Microbiology: Recent Results (from the past 240 hour(s))  Surgical pcr screen     Status: Abnormal   Collection Time: 10/05/15  1:32 PM  Result Value Ref Range Status   MRSA, PCR NEGATIVE NEGATIVE Final   Staphylococcus aureus POSITIVE (A) NEGATIVE Final    Comment:        The Xpert SA Assay (FDA approved for NASAL specimens in patients over 82 years of age), is one component of a comprehensive surveillance program.  Test performance has been validated by [facility] for patients greater than or equal to 51 year old. It is not intended to diagnose infection nor to guide or monitor treatment.   Anaerobic culture     Status: None   Collection Time: 10/05/15  4:23 PM  Result Value Ref Range Status   Specimen Description WOUND  Final   Special Requests RIGHT AV GORTEX GRAFT  Final   Gram Stain   Final    NO WBC SEEN NO SQUAMOUS EPITHELIAL CELLS SEEN NO ORGANISMS SEEN Performed at [facility]    Culture   Final    NO ANAEROBES ISOLATED Performed at [facility]    Report Status 10/10/2015 FINAL  Final  Wound culture     Status: None   Collection Time: 10/05/15  4:23 PM  Result Value Ref Range Status   Specimen Description WOUND  Final   Special Requests RIGHT AV  GORTEX GRAFT  Final   Gram Stain   Final    NO WBC SEEN NO SQUAMOUS EPITHELIAL CELLS SEEN NO ORGANISMS SEEN Performed at [facility]    Culture   Final    FEW STAPHYLOCOCCUS AUREUS Note: RIFAMPIN AND GENTAMICIN SHOULD NOT BE USED AS SINGLE DRUGS FOR TREATMENT OF STAPH INFECTIONS. Performed at [facility]    Report Status 10/09/2015 FINAL  Final   Organism ID, Bacteria STAPHYLOCOCCUS AUREUS  Final      Susceptibility   Staphylococcus aureus - MIC*    CLINDAMYCIN <=0.25 SENSITIVE Sensitive     ERYTHROMYCIN 0.5 SENSITIVE Sensitive     GENTAMICIN <=0.5 SENSITIVE Sensitive     LEVOFLOXACIN 0.25 SENSITIVE Sensitive     OXACILLIN <=0.25 SENSITIVE Sensitive     RIFAMPIN <=0.5 SENSITIVE Sensitive     TRIMETH/SULFA <=10 SENSITIVE Sensitive     VANCOMYCIN 1 SENSITIVE Sensitive     TETRACYCLINE <=1 SENSITIVE Sensitive     MOXIFLOXACIN <=0.25 SENSITIVE Sensitive     * FEW STAPHYLOCOCCUS AUREUS     Labs: Basic Metabolic Panel: No results for input(s): NA, K, CL, CO2, GLUCOSE, BUN, CREATININE, CALCIUM, MG, PHOS in the last 168 hours. Liver Function Tests: No results for input(s): AST, ALT, ALKPHOS, BILITOT, PROT, ALBUMIN in the last 168 hours. No results for input(s): LIPASE, AMYLASE in the last 168 hours. No results for input(s): AMMONIA in the last  168 hours. CBC: No results for input(s): WBC, NEUTROABS, HGB, HCT, MCV, PLT in the last 168 hours. Cardiac Enzymes: No results for input(s): CKTOTAL, CKMB, CKMBINDEX, TROPONINI in the last 168 hours. BNP: BNP (last 3 results) No results for input(s): BNP in the last 8760 hours.  ProBNP (last 3 results) No results for input(s): PROBNP in the last 8760 hours.  CBG: No results for input(s): GLUCAP in the last 168 hours.   SIGNED: Time coordinating discharge: Over 30 minutes  Debbora Presto, MD  [facility] 10/14/2015, 7:42 PM Pager [phone]  If 7PM-7AM, please contact  night-coverage [email or web address] Password [email or web address]

## 2015-10-21 ENCOUNTER — Encounter: Payer: Self-pay | Admitting: Neurology

## 2015-10-21 ENCOUNTER — Ambulatory Visit (INDEPENDENT_AMBULATORY_CARE_PROVIDER_SITE_OTHER): Payer: Medicare Other | Admitting: Neurology

## 2015-10-21 ENCOUNTER — Ambulatory Visit: Payer: Medicare Other | Admitting: Neurology

## 2015-10-21 VITALS — BP 132/76 | HR 102 | Ht 65.5 in | Wt 175.0 lb

## 2015-10-21 DIAGNOSIS — F17200 Nicotine dependence, unspecified, uncomplicated: Secondary | ICD-10-CM

## 2015-10-21 DIAGNOSIS — R569 Unspecified convulsions: Secondary | ICD-10-CM

## 2015-10-21 NOTE — Patient Instructions (Signed)
1.  Continue Keppra 500mg  twice daily (plus one dose following hemodialysis) 2.  Will get MRI of brain without contrast 3.  Will get EEG 4.  No driving.  As per [place] law, you must be seizure-free for 6 months before you can drive. 5.  Follow up in 5 months

## 2015-10-21 NOTE — Progress Notes (Signed)
NEUROLOGY CONSULTATION NOTE  Robert Bradshaw MRN: [id number] DOB: Mar 16, 1981  Referring provider: Jeanine Luz, FNP Primary care provider: Jeanine Luz, FNP  Reason for consult:  seizures  HISTORY OF PRESENT ILLNESS: Robert Bradshaw is a 35 year old right-handed man with HIV, ESRD, hypercholesterolemia, hypertension, parathyroid abnormality and GERD who presents for seizures.  He is accompanied by his wife, who provides some history.  Further history obtained by PCP and hospital notes..  Robert Bradshaw has been HIV positive since 1999.  He has ESRD and undergoes HD on Tuesday, Thursday and Saturday.   He began having seizures in November-December 2016.  Etiology of onset is unknown.  They would always occur following a coughing spell or episode of nausea and vomiting.  He would feel lightheaded and everything would go black.  On the ground, he would convulse for up to 5 minutes but would be unresponsive up to 10 minutes.  There is no tongue biting or incontinence.  It would take 10 to 15 minutes to completely return to baseline.  He was living in [place], [place] at the time.  He was given Keppra and was supposed to follow up with a local neurologist, who was booked out until May.  However, he and his wife moved to [place] in March.  Seizures were occuring everyday.  Once he started the Keppra, they resolved.  However, he had run out of Keppra and they recurred until he established care with his new PCP on 09/16/15, who refilled his prescription.  He has not had a seizure since then.    He takes Keppra  twice daily, plus extra  following HD.  CT of head from 09/15/15 was personally reviewed and revealed no acute intracranial abnormality. Labs from 10/07/15 show Na 137, K 3.4, Cl 97, CO2 20, glucose 151, BUN 41, Cr 17.41 and GFR 3.  Of note, he was hospitalized on May 2 for hematemesis.  EGD revealed grade 1 esophageal varices as well as a nonbleeding gastric ulcer.  He is being managed  medically for PUD.  PAST MEDICAL HISTORY: Past Medical History  Diagnosis Date  . Renal disorder   . Hypertension   . Immune deficiency disorder (HCC)   . HIV (human immunodeficiency virus infection) (HCC)   . GERD (gastroesophageal reflux disease)   . Migraines   . Parathyroid abnormality (HCC)     PAST SURGICAL HISTORY: Past Surgical History  Procedure Laterality Date  . Av fistula placement    . Esophagogastroduodenoscopy (egd) with propofol N/A 09/23/2015    Procedure: ESOPHAGOGASTRODUODENOSCOPY (EGD) WITH PROPOFOL;  Surgeon: Graylin Shiver, MD;  Location: [facility] ENDOSCOPY;  Service: Endoscopy;  Laterality: N/A;  . Insertion of dialysis catheter N/A 10/03/2015    Procedure: INSERTION OF DIALYSIS CATHETER RIGHT FEMORAL VEIN;  Surgeon: Sherren Kerns, MD;  Location: [facility] OR;  Service: Vascular;  Laterality: N/A;  . Avgg removal Right 10/05/2015    Procedure: REMOVAL OF ARTERIOVENOUS GORETEX GRAFT (AVGG) RIGHT FOREARM;  Surgeon: Sherren Kerns, MD;  Location: [facility] OR;  Service: Vascular;  Laterality: Right;    MEDICATIONS: Current Outpatient Prescriptions on File Prior to Visit  Medication Sig Dispense Refill  . atorvastatin (LIPITOR) 20 MG tablet Take 20 mg by mouth at bedtime.    . busPIRone (BUSPAR) 10 MG tablet Take 1 tablet (10 mg total) by mouth 2 (two) times daily. 60 tablet 0  . camphor-menthol (SARNA) lotion Apply topically as needed for itching. 222 mL 0  . cinacalcet (SENSIPAR) 60 MG tablet  Take 120 mg by mouth at bedtime.    Marland Kitchen efavirenz (SUSTIVA) 600 MG tablet Take 1 tablet (600 mg total) by mouth at bedtime. 30 tablet 1  . Ferric Citrate (AURYXIA) 1 GM 210 MG(Fe) TABS Take 630 mg by mouth 3 (three) times daily with meals.    . folic acid-vitamin b complex-vitamin c-selenium-zinc (DIALYVITE) 3 MG TABS tablet Take 1 tablet by mouth daily.    . hydrOXYzine (ATARAX/VISTARIL) 25 MG tablet Take 1 tablet (25 mg total) by mouth 3 (three) times daily as needed for itching (refractory  itching). 30 tablet 0  . lamiVUDine (EPIVIR) 150 MG tablet Take 1 tablet (150 mg total) by mouth 2 (two) times a week. 10 tablet 1  . levETIRAcetam (KEPPRA) 500 MG tablet Take 1 tablet (500 mg total) by mouth 2 (two) times daily. 60 tablet 2  . Multiple Vitamins-Iron (MULTIVITAMINS WITH IRON) TABS tablet Take 1 tablet by mouth daily.    . ondansetron (ZOFRAN-ODT) 4 MG disintegrating tablet Take 4 mg by mouth every 8 (eight) hours as needed for nausea or vomiting.    Marland Kitchen oxyCODONE (OXY IR/ROXICODONE) 5 MG immediate release tablet Take 1 tablet (5 mg total) by mouth every 4 (four) hours as needed for moderate pain or severe pain. 65 tablet 0  . pantoprazole (PROTONIX) 20 MG tablet Take 2 tablets (40 mg total) by mouth daily. 14 tablet 0  . promethazine (PHENERGAN) 25 MG tablet Take 25 mg by mouth every 6 (six) hours as needed for nausea or vomiting.    . ranitidine (ZANTAC) 150 MG tablet Take 1 tablet (150 mg total) by mouth daily. 90 tablet 1  . sucralfate (CARAFATE) 1 g tablet Take 1 tablet (1 g total) by mouth 2 (two) times daily. 14 tablet 0  . SUMAtriptan (IMITREX) 100 MG tablet Take 0.5-1 tablets by mouth at the onset of a headache. May repeat in 2 hours if headache persists or recurs. 10 tablet 0  . tenofovir (VIREAD) 300 MG tablet Take 1 tablet (300 mg total) by mouth once a week. 30 tablet 1   No current facility-administered medications on file prior to visit.    ALLERGIES: No Known Allergies  FAMILY HISTORY: Family History  Problem Relation Age of Onset  . Hypertension Mother   . Diabetes Mother   . Hypertension Maternal Grandfather     SOCIAL HISTORY: Social History   Social History  . Marital Status: Married    Spouse Name: N/A  . Number of Children: 0  . Years of Education: 10   Occupational History  . Disability    Social History Main Topics  . Smoking status: Current Every Day Smoker -- 0.50 packs/day for 20 years    Types: Cigarettes  . Smokeless tobacco: Not on  file  . Alcohol Use: No  . Drug Use: 7.00 per week    Special: Marijuana  . Sexual Activity: Not on file   Other Topics Concern  . Not on file   Social History Narrative   Fun: Fish and hunt    REVIEW OF SYSTEMS: Constitutional: No fevers, chills, or sweats, no generalized fatigue, change in appetite Eyes: No visual changes, double vision, eye pain Ear, nose and throat: No hearing loss, ear pain, nasal congestion, sore throat Cardiovascular: No chest pain, palpitations Respiratory:  No shortness of breath at rest or with exertion, wheezes GastrointestinaI: No nausea, vomiting, diarrhea, abdominal pain, fecal incontinence Genitourinary:  No dysuria, urinary retention or frequency Musculoskeletal:  No neck pain,  back pain Neurological: as above Psychiatric: No depression, insomnia, anxiety Endocrine: No palpitations, fatigue, diaphoresis, mood swings, change in appetite, change in weight, increased thirst Hematologic/Lymphatic:  No purpura, petechiae. Allergic/Immunologic: no itchy/runny eyes, nasal congestion, recent allergic reactions, rashes  PHYSICAL EXAM: Filed Vitals:   10/21/15 1042  BP: 132/76  Pulse: 102   General: No acute distress.  Head:  Normocephalic/atraumatic Eyes:  fundi examined but not visualized Neck: supple, no paraspinal tenderness, full range of motion Back: No paraspinal tenderness Heart: regular rate and rhythm Lungs: Clear to auscultation bilaterally. Vascular: No carotid bruits. Neurological Exam: Mental status: alert and oriented to person, place, and time, recent and remote memory intact, fund of knowledge intact, attention and concentration intact, speech fluent and not dysarthric, language intact. Cranial nerves: CN I: not tested CN II: pupils equal, round and reactive to light, visual fields intact CN III, IV, VI:  full range of motion, no nystagmus, no ptosis CN V: facial sensation intact CN VII: upper and lower face symmetric CN VIII:  hearing intact CN IX, X: gag intact, uvula midline CN XI: sternocleidomastoid and trapezius muscles intact CN XII: tongue midline Bulk & Tone: normal, no fasciculations. Motor:  5/5 throughout Sensation:  Pinprick and vibration sensation intact. Deep Tendon Reflexes:  2+ throughout, toes downgoing.  Finger to nose testing:  Without dysmetria.  Heel to shin:  Without dysmetria.  Gait:  Normal station and stride.  Able to turn and tandem walk. Romberg negative.  IMPRESSION: Seizures.  It is questionable that these are seizures.  Since they occur following bouts of coughing or vomiting, they could be convulsive syncope. Tobacco use  PLAN: 1.  Continue Keppra 500mg  twice daily and 500mg  post HD. 2.  Check MRI of brain 3.  Check EEG 4.  No driving.  Informed him that [place] law states he must be seizure-free for 6 months before he may resume driving. 5.  Smoking cessation 6.  Follow up in 5 months.  Thank you for allowing me to take part in the care of this patient.  Shon MilletAdam Jovi Zavadil, DO  CC:  Jeanine LuzGregory Calone, FNP

## 2015-10-22 ENCOUNTER — Encounter: Payer: Self-pay | Admitting: Vascular Surgery

## 2015-10-22 ENCOUNTER — Ambulatory Visit (INDEPENDENT_AMBULATORY_CARE_PROVIDER_SITE_OTHER): Payer: Medicare Other | Admitting: Vascular Surgery

## 2015-10-22 ENCOUNTER — Encounter: Payer: Medicare Other | Admitting: Vascular Surgery

## 2015-10-22 VITALS — BP 145/92 | HR 100 | Temp 98.3°F | Ht 65.5 in | Wt 176.0 lb

## 2015-10-22 DIAGNOSIS — T827XXD Infection and inflammatory reaction due to other cardiac and vascular devices, implants and grafts, subsequent encounter: Secondary | ICD-10-CM

## 2015-10-22 MED ORDER — HYDROXYZINE HCL 25 MG PO TABS: 25.0000 mg | ORAL_TABLET | Freq: Three times a day (TID) | ORAL | Status: AC | PRN

## 2015-10-22 NOTE — Progress Notes (Signed)
Rx for HydrOXYzine (Atarax / Vistaril) called in to [id number] at [address] as per Dr. Darrick PennaFields verbal order.  RX printed out in the office and the patient had gone home before we realized it.

## 2015-10-22 NOTE — Addendum Note (Signed)
Addended by: Sherren KernsFIELDS, Carlen Fils E on: 10/22/2015 02:57 PM   Modules accepted: Orders

## 2015-10-22 NOTE — Progress Notes (Signed)
Patient is a 35 year old male who returns today for follow-up after placement of a right thigh palindrome catheter as well as removal of a right forearm AV graft. The right forearm AV graft was removed for presumed infection as well as swelling secondary to central vein occlusions. All of this was done elsewhere.  Past Medical History  Diagnosis Date  . Renal disorder   . Hypertension   . Immune deficiency disorder (HCC)   . HIV (human immunodeficiency virus infection) (HCC)   . GERD (gastroesophageal reflux disease)   . Migraines   . Parathyroid abnormality [facility](HCC)     Past Surgical History  Procedure Laterality Date  . Av fistula placement    . Esophagogastroduodenoscopy (egd) with propofol N/A 09/23/2015    Procedure: ESOPHAGOGASTRODUODENOSCOPY (EGD) WITH PROPOFOL;  Surgeon: Graylin ShiverSalem F Ganem, MD;  [facility];  Service: Endoscopy;  Laterality: N/A;  . Insertion of dialysis catheter N/A 10/03/2015    Procedure: INSERTION OF DIALYSIS CATHETER RIGHT FEMORAL VEIN;  Surgeon: Sherren Kernsharles E Leba Tibbitts, MD;  [facility];  Service: Vascular;  Laterality: N/A;  . Avgg removal Right 10/05/2015    Procedure: REMOVAL OF ARTERIOVENOUS GORETEX GRAFT (AVGG) RIGHT FOREARM;  Surgeon: Sherren Kernsharles E Chrisean Kloth, MD;  [facility];  Service: Vascular;  Laterality: Right;    Current Outpatient Prescriptions on File Prior to Visit  Medication Sig Dispense Refill  . atorvastatin (LIPITOR) 20 MG tablet Take 20 mg by mouth at bedtime.    . busPIRone (BUSPAR) 10 MG tablet Take 1 tablet (10 mg total) by mouth 2 (two) times daily. 60 tablet 0  . camphor-menthol (SARNA) lotion Apply topically as needed for itching. 222 mL 0  . cinacalcet (SENSIPAR) 60 MG tablet Take 120 mg by mouth at bedtime.    Marland Kitchen. efavirenz (SUSTIVA) 600 MG tablet Take 1 tablet (600 mg total) by mouth at bedtime. 30 tablet 1  . Ferric Citrate (AURYXIA) 1 GM 210 MG(Fe) TABS Take 630 mg by mouth 3 (three) times daily with meals.    . folic acid-vitamin  b complex-vitamin c-selenium-zinc (DIALYVITE) 3 MG TABS tablet Take 1 tablet by mouth daily.    . hydrOXYzine (ATARAX/VISTARIL) 25 MG tablet Take 1 tablet (25 mg total) by mouth 3 (three) times daily as needed for itching (refractory itching). 30 tablet 0  . lamiVUDine (EPIVIR) 150 MG tablet Take 1 tablet (150 mg total) by mouth 2 (two) times a week. 10 tablet 1  . levETIRAcetam (KEPPRA) 500 MG tablet Take 1 tablet (500 mg total) by mouth 2 (two) times daily. 60 tablet 2  . Multiple Vitamins-Iron (MULTIVITAMINS WITH IRON) TABS tablet Take 1 tablet by mouth daily.    . ondansetron (ZOFRAN-ODT) 4 MG disintegrating tablet Take 4 mg by mouth every 8 (eight) hours as needed for nausea or vomiting.    Marland Kitchen. oxyCODONE (OXY IR/ROXICODONE) 5 MG immediate release tablet Take 1 tablet (5 mg total) by mouth every 4 (four) hours as needed for moderate pain or severe pain. 65 tablet 0  . pantoprazole (PROTONIX) 20 MG tablet Take 2 tablets (40 mg total) by mouth daily. 14 tablet 0  . promethazine (PHENERGAN) 25 MG tablet Take 25 mg by mouth every 6 (six) hours as needed for nausea or vomiting.    . ranitidine (ZANTAC) 150 MG tablet Take 1 tablet (150 mg total) by mouth daily. 90 tablet 1  . sucralfate (CARAFATE) 1 g tablet Take 1 tablet (1 g total) by mouth 2 (two) times daily. 14 tablet 0  .  SUMAtriptan (IMITREX) 100 MG tablet Take 0.5-1 tablets by mouth at the onset of a headache. May repeat in 2 hours if headache persists or recurs. 10 tablet 0  . tenofovir (VIREAD) 300 MG tablet Take 1 tablet (300 mg total) by mouth once a week. 30 tablet 1   No current facility-administered medications on file prior to visit.     Physical exam:  Filed Vitals:   10/22/15 1418  BP: 145/92  Pulse: 100  Temp: 98.3 F (36.8 C)  TempSrc: Oral  Height: 5' 5.5" (1.664 m)  Weight: 176 lb (79.833 kg)  SpO2: 96%    Right upper extremity: Right hand slightly more edematous than left well-healed incisions right  forearm  Assessment: Doing well status post graft removal right arm  Plan: Patient will follow-up on an as-needed basis. Staples removed from the right arm today.  Fabienne Bruns, MD [facility] [place] Office: [phone] Pager: [phone]

## 2015-10-27 ENCOUNTER — Other Ambulatory Visit: Payer: Self-pay | Admitting: Family

## 2015-10-28 ENCOUNTER — Ambulatory Visit (INDEPENDENT_AMBULATORY_CARE_PROVIDER_SITE_OTHER): Payer: Medicare Other | Admitting: Infectious Disease

## 2015-10-28 ENCOUNTER — Encounter: Payer: Self-pay | Admitting: Infectious Disease

## 2015-10-28 ENCOUNTER — Telehealth: Payer: Self-pay | Admitting: *Deleted

## 2015-10-28 ENCOUNTER — Other Ambulatory Visit: Payer: Self-pay

## 2015-10-28 ENCOUNTER — Ambulatory Visit: Payer: Medicare Other | Admitting: *Deleted

## 2015-10-28 VITALS — BP 180/103 | HR 90 | Temp 97.5°F | Ht 64.0 in | Wt 176.0 lb

## 2015-10-28 DIAGNOSIS — E7800 Pure hypercholesterolemia, unspecified: Secondary | ICD-10-CM | POA: Insufficient documentation

## 2015-10-28 DIAGNOSIS — Z992 Dependence on renal dialysis: Secondary | ICD-10-CM | POA: Diagnosis not present

## 2015-10-28 DIAGNOSIS — G40909 Epilepsy, unspecified, not intractable, without status epilepticus: Secondary | ICD-10-CM

## 2015-10-28 DIAGNOSIS — N186 End stage renal disease: Secondary | ICD-10-CM

## 2015-10-28 DIAGNOSIS — Z01810 Encounter for preprocedural cardiovascular examination: Secondary | ICD-10-CM

## 2015-10-28 DIAGNOSIS — F321 Major depressive disorder, single episode, moderate: Secondary | ICD-10-CM

## 2015-10-28 DIAGNOSIS — E039 Hypothyroidism, unspecified: Secondary | ICD-10-CM | POA: Diagnosis not present

## 2015-10-28 DIAGNOSIS — B20 Human immunodeficiency virus [HIV] disease: Secondary | ICD-10-CM | POA: Diagnosis present

## 2015-10-28 HISTORY — DX: Epilepsy, unspecified, not intractable, without status epilepticus: G40.909

## 2015-10-28 HISTORY — DX: Human immunodeficiency virus (HIV) disease: B20

## 2015-10-28 HISTORY — DX: Hypothyroidism, unspecified: E03.9

## 2015-10-28 HISTORY — DX: Pure hypercholesterolemia, unspecified: E78.00

## 2015-10-28 MED ORDER — CEPHALEXIN 500 MG PO CAPS: 500.0000 mg | ORAL_CAPSULE | Freq: Two times a day (BID) | ORAL | Status: AC

## 2015-10-28 MED ORDER — LAMIVUDINE 10 MG/ML PO SOLN: 50.0000 mg | Freq: Every day | ORAL | Status: AC

## 2015-10-28 MED ORDER — DOLUTEGRAVIR SODIUM 50 MG PO TABS: 50.0000 mg | ORAL_TABLET | Freq: Every day | ORAL | Status: DC

## 2015-10-28 MED ORDER — TENOFOVIR DISOPROXIL FUMARATE 300 MG PO TABS: 300.0000 mg | ORAL_TABLET | ORAL | Status: AC

## 2015-10-28 NOTE — Progress Notes (Addendum)
Chief complaint: Establish care for HIV Subjective:    Patient ID: Robert Bradshaw, male    DOB: 28-Feb-1981, 35 y.o.   MRN: [id number]  HPI  35 year old [place] man who had been diagnosed with HIV in [facility] and started on ARV's most recently on renally dosed regimen (based on fact that he is on HD) of Daily Sustiva, Weekly Viread though for some reason he is on a very odd dose of lamivudine at 171m twice weekly--not the proper dose of this drug.  Regardless he has maintained very nice virological suppression.   [place] [facility]  He is running out of HD access sites due to recurrent infections of HD catheters and AV fistulas.  He has a seizure disorder and per his wife he fell in March because of this--though he himself denies this.  He does suffer from signfiicant depression as does his wife who is experiencing care-giver fatigue.  Past Medical History  Diagnosis Date  . Renal disorder   . Hypertension   . Immune deficiency disorder ([place]   . HIV (human immunodeficiency virus infection) ([place]   . GERD (gastroesophageal reflux disease)   . Migraines   . Parathyroid abnormality (HCaptain Cook   . AIDS (HRegister 10/28/2015  . Hyperlipidemia, group A 10/28/2015  . Seizure disorder (HPrien 10/28/2015  . Hypothyroidism 10/28/2015    Past Surgical History  Procedure Laterality Date  . Av fistula placement    . Esophagogastroduodenoscopy (egd) with propofol N/A 09/23/2015    Procedure: ESOPHAGOGASTRODUODENOSCOPY (EGD) WITH PROPOFOL;  Surgeon: SWonda Horner MD;  [facility];  Service: Endoscopy;  Laterality: N/A;  . Insertion of dialysis catheter N/A 10/03/2015    Procedure: INSERTION OF DIALYSIS CATHETER RIGHT FEMORAL VEIN;  Surgeon: CElam Dutch MD;  [place]  Service: Vascular;  Laterality: N/A;  . Avgg removal Right 10/05/2015    Procedure: REMOVAL OF  ARTERIOVENOUS GORETEX GRAFT ([place] RIGHT FOREARM;  Surgeon: CElam Dutch MD;  [facility];  Service: Vascular;  Laterality: Right;    Family History  Problem Relation Age of Onset  . Hypertension Mother   . Diabetes Mother   . Hypertension Maternal Grandfather       Social History   Social History  . Marital Status: Married    Spouse Name: N/A  . Number of Children: 0  . Years of Education: 10   Occupational History  . Disability    Social History Main Topics  . Smoking status: Current Every Day Smoker -- 0.50 packs/day for 20 years    Types: Cigarettes  . Smokeless tobacco: Never Used  . Alcohol Use: No  . Drug Use: 7.00 per week    Special: Marijuana  . Sexual Activity: Not Asked   Other Topics Concern  . None   Social History Narrative   Fun: Fish and hunt    No Known Allergies   Current outpatient prescriptions:  .  atorvastatin (LIPITOR) 20 MG tablet, Take 20 mg by mouth at bedtime., Disp: , Rfl:  .  busPIRone (BUSPAR) 10 MG tablet, Take 1 tablet (10 mg total) by mouth 2 (two) times daily., Disp: 60 tablet, Rfl: 0 .  camphor-menthol (SARNA) lotion, Apply topically as needed for itching., Disp: 222 mL, Rfl: 0 .  cinacalcet (SENSIPAR) 60 MG tablet, Take 120 mg by mouth at bedtime., Disp: , Rfl:  .  Ferric Citrate (AURYXIA) 1 GM 210 MG(Fe) TABS, Take 630 mg by mouth 3 (three) times daily with meals., Disp: , Rfl:  .  folic acid-vitamin b complex-vitamin c-selenium-zinc (DIALYVITE) 3 MG TABS tablet, Take 1 tablet by mouth daily., Disp: , Rfl:  .  hydrOXYzine (ATARAX/VISTARIL) 25 MG tablet, Take 1 tablet (25 mg total) by mouth 3 (three) times daily as needed for itching (refractory itching)., Disp: 30 tablet, Rfl: 0 .  levETIRAcetam (KEPPRA) 500 MG tablet, Take 1 tablet (500 mg total) by mouth 2 (two) times daily., Disp: 60 tablet, Rfl: 2 .  Multiple Vitamins-Iron (MULTIVITAMINS WITH IRON) TABS tablet, Take 1 tablet by mouth daily., Disp: , Rfl:  .   ondansetron (ZOFRAN-ODT) 4 MG disintegrating tablet, Take 4 mg by mouth every 8 (eight) hours as needed for nausea or vomiting., Disp: , Rfl:  .  oxyCODONE (OXY IR/ROXICODONE) 5 MG immediate release tablet, Take 1 tablet (5 mg total) by mouth every 4 (four) hours as needed for moderate pain or severe pain., Disp: 65 tablet, Rfl: 0 .  pantoprazole (PROTONIX) 20 MG tablet, TAKE 2 TABLETS BY MOUTH DAILY, Disp: 180 tablet, Rfl: 1 .  promethazine (PHENERGAN) 25 MG tablet, Take 25 mg by mouth every 6 (six) hours as needed for nausea or vomiting., Disp: , Rfl:  .  ranitidine (ZANTAC) 150 MG tablet, Take 1 tablet (150 mg total) by mouth daily., Disp: 90 tablet, Rfl: 1 .  sucralfate (CARAFATE) 1 g tablet, TAKE 1 TABLET BY MOUTH TWICE A DAY, Disp: 180 tablet, Rfl: 1 .  SUMAtriptan (IMITREX) 100 MG tablet, Take 0.5-1 tablets by mouth at the onset of a headache. May repeat in 2 hours if headache persists or recurs., Disp: 10 tablet, Rfl: 0 .  tenofovir (VIREAD) 300 MG tablet, Take 1 tablet (300 mg total) by mouth once a week., Disp: 30 tablet, Rfl: 11 .  dolutegravir (TIVICAY) 50 MG tablet, Take 1 tablet (50 mg total) by mouth daily., Disp: 30 tablet, Rfl: 11 .  lamiVUDine (EPIVIR) 10 MG/ML solution, Take 5 mLs (50 mg total) by mouth daily., Disp: 240 mL, Rfl: 12   Review of Systems  Constitutional: Negative for fever, chills, diaphoresis, activity change, appetite change, fatigue and unexpected weight change.  HENT: Negative for congestion, rhinorrhea, sinus pressure, sneezing, sore throat and trouble swallowing.   Eyes: Negative for photophobia and visual disturbance.  Respiratory: Negative for cough, chest tightness, shortness of breath, wheezing and stridor.   Cardiovascular: Negative for chest pain, palpitations and leg swelling.  Gastrointestinal: Negative for nausea, vomiting, abdominal pain, diarrhea, constipation, blood in stool, abdominal distention and anal bleeding.  Genitourinary: Negative for  dysuria, hematuria, flank pain and difficulty urinating.  Musculoskeletal: Negative for myalgias, back pain, joint swelling, arthralgias and gait problem.  Skin: Negative for color change, pallor, rash and wound.  Neurological: Negative for dizziness, tremors, weakness and light-headedness.  Hematological: Negative for adenopathy. Does not bruise/bleed easily.  Psychiatric/Behavioral: Positive for dysphoric mood. Negative for behavioral problems, confusion, sleep disturbance, decreased concentration and agitation.       Objective:   Physical Exam  Constitutional: He is oriented to person, place, and time. He appears well-developed and well-nourished.  HENT:  Head: Normocephalic and atraumatic.  Eyes: Conjunctivae and EOM are normal.  Neck: Normal range of motion. Neck supple.  Cardiovascular: Normal rate and regular rhythm.   Pulmonary/Chest: Effort normal. No respiratory distress. He has no wheezes.  Abdominal: Soft. He exhibits no distension.  Musculoskeletal: Normal range of motion.  He exhibits no edema or tenderness.  Neurological: He is alert and oriented to person, place, and time.  Skin: Skin is warm and dry. No rash noted. No erythema. No pallor.  Psychiatric: His speech is normal. He is withdrawn. He exhibits a depressed mood.          Assessment & Plan:   AIDS/HIV: I am changing him to Tivicay daily along with daily epivir at 95m and continuing weekly Viread.  He is to RTC in 4 weeks for repeat labs and visit with me afterwards.   I believe we will have FDA approval of several STR in HD including Genvoya, Odefsey but they are not at this time.  CKD on HD: If he wants to be a renal transplant pt I have also cautioned him that he will need to manage his BP and other diseases that led to his kidneys failing after renal transplantation  HTN: poorly controlled based on todays  VS though he and his wife claim he does not need anti-HSTISVEs\  Seizure disorder: on keprra.  IF he has other drugs considered need caution as many AED interact with DTG  Depression: had him meet with JLeveda Annatoday who also met with this wife.  We spent greater than 60 minutes with the patient including greater than 50% of time in face to face counsel of the patient re his HIV, his new ARV regimen, his HTN, ESRD, recurrent infections, seizure disorder, depression  and in coordination of his care.   ADDENDUM: I had not realized that this patient had very recently left the hospital AMA after surgery on infected AV graft with MSSA. I will ask him to start keflex and take this for a month and then followup with [place]

## 2015-10-28 NOTE — BH Specialist Note (Signed)
Counselor met with Robert Bradshaw today in the exam room for a warm handoff from Dr. Tommy Medal.  Patient was accompanied by his wife.  Patient was oriented times four with flat/sad affect but proper dress. Patient was alert but not very talkative.  Patient's wife was a bit over bearing and dominated the conversation. Patient's wife complained non stop about patient and would talk over him.  Counselor directed questions directly towards patient and indicated that he should answer for himself.  Patient shared that he was generally not doing so well nor handling appropriately what was going on in his life with his HIV diagnosis and personal relationships. Counselor provided support and encouragement.  Counselor recommended that patient meet with counselor to help process what is going on in his life and develop better coping skills. Patient made an appointment with counselor when he checked out.  Rolena Infante, MA, LPC [facility]

## 2015-10-28 NOTE — Telephone Encounter (Signed)
Per Dr Daiva EvesVan Dam called to patient to advise him of the Keflex he sent to the hospital. Had to leave a message for him the call the office back asap. Please see note from 10/28/15 for order from the doctor.

## 2015-10-28 NOTE — Addendum Note (Signed)
Addended by: Acey LavVAN DAM, Tanaysha Alkins N on: 10/28/2015 02:49 PM   Modules accepted: Orders

## 2015-10-28 NOTE — Progress Notes (Signed)
Can someone call Mr Robert Bradshaw and ask  Him to take keflex 500mg  TWICE daily. I had not realized he had recently been in the hospital for an infected graft with MSSA (he left AMA)

## 2015-10-28 NOTE — Patient Instructions (Signed)
Your new regimen is   Tivicay 50mg  yellow tablet once daily  Epivir liquid 50mg  5 ml daily  Viread once weekly

## 2015-10-29 ENCOUNTER — Encounter: Payer: Self-pay | Admitting: Vascular Surgery

## 2015-10-30 ENCOUNTER — Ambulatory Visit (INDEPENDENT_AMBULATORY_CARE_PROVIDER_SITE_OTHER)
Admission: RE | Admit: 2015-10-30 | Discharge: 2015-10-30 | Disposition: A | Discharge status: Home / Self Care | Payer: Medicare Other | Source: Ambulatory Visit | Attending: Vascular Surgery | Admitting: Vascular Surgery

## 2015-10-30 ENCOUNTER — Ambulatory Visit (HOSPITAL_COMMUNITY)
Admission: RE | Admit: 2015-10-30 | Discharge: 2015-10-30 | Disposition: A | Discharge status: Home / Self Care | Payer: Medicare Other | Source: Ambulatory Visit | Attending: Vascular Surgery | Admitting: Vascular Surgery

## 2015-10-30 DIAGNOSIS — E785 Hyperlipidemia, unspecified: Secondary | ICD-10-CM | POA: Insufficient documentation

## 2015-10-30 DIAGNOSIS — K219 Gastro-esophageal reflux disease without esophagitis: Secondary | ICD-10-CM | POA: Diagnosis not present

## 2015-10-30 DIAGNOSIS — I120 Hypertensive chronic kidney disease with stage 5 chronic kidney disease or end stage renal disease: Secondary | ICD-10-CM | POA: Insufficient documentation

## 2015-10-30 DIAGNOSIS — N186 End stage renal disease: Secondary | ICD-10-CM | POA: Diagnosis not present

## 2015-10-30 DIAGNOSIS — B20 Human immunodeficiency virus [HIV] disease: Secondary | ICD-10-CM | POA: Insufficient documentation

## 2015-10-30 DIAGNOSIS — Z01810 Encounter for preprocedural cardiovascular examination: Secondary | ICD-10-CM | POA: Insufficient documentation

## 2015-11-04 ENCOUNTER — Ambulatory Visit (INDEPENDENT_AMBULATORY_CARE_PROVIDER_SITE_OTHER): Payer: Medicare Other | Admitting: Vascular Surgery

## 2015-11-04 ENCOUNTER — Ambulatory Visit: Payer: Medicare Other | Admitting: *Deleted

## 2015-11-04 ENCOUNTER — Ambulatory Visit: Payer: Medicare Other | Admitting: Infectious Disease

## 2015-11-04 ENCOUNTER — Other Ambulatory Visit: Payer: Self-pay

## 2015-11-04 ENCOUNTER — Telehealth: Payer: Self-pay

## 2015-11-04 VITALS — BP 147/88 | HR 98 | Temp 98.6°F | Resp 20 | Ht 64.0 in | Wt 174.0 lb

## 2015-11-04 DIAGNOSIS — Z992 Dependence on renal dialysis: Secondary | ICD-10-CM

## 2015-11-04 DIAGNOSIS — N186 End stage renal disease: Secondary | ICD-10-CM | POA: Diagnosis not present

## 2015-11-04 DIAGNOSIS — Z202 Contact with and (suspected) exposure to infections with a predominantly sexual mode of transmission: Secondary | ICD-10-CM

## 2015-11-04 MED ORDER — METRONIDAZOLE 500 MG PO TABS: 2000.0000 mg | ORAL_TABLET | Freq: Once | ORAL | Status: DC

## 2015-11-04 NOTE — Telephone Encounter (Signed)
Patient walked into clinic stating someone he had sex with last month told him he has trichomonas .  He wants to know if he can be treated? He has not had sex with his wife since the encounter with the infected person.   His wife is aware of the sexual encounter but not aware he was exposed to trichomonas.  Please advise.

## 2015-11-04 NOTE — Telephone Encounter (Signed)
Per Dr Ninetta LightsHatcher patient can be treated with Flagyl 2 grams once.  I will contact patient for pharmacy .   Patients wife answered phone and said he was out. This is the only number I have.  Only left message that I called.   Laurell Josephsammy K Takerra Lupinacci, RN

## 2015-11-04 NOTE — Telephone Encounter (Signed)
He should be tested for all STD's the next time we interact with him GC chlamydia OP, rectal and urine. I agree with the flagyl 2 grams for presumptive rx of trich. His wife will want to also get treated and other partners

## 2015-11-05 ENCOUNTER — Telehealth: Payer: Self-pay

## 2015-11-05 ENCOUNTER — Inpatient Hospital Stay (HOSPITAL_COMMUNITY): Admission: RE | Admit: 2015-11-05 | Payer: Medicare Other | Source: Ambulatory Visit

## 2015-11-05 NOTE — Telephone Encounter (Signed)
rec'd call from patient.  Stated that he has decided NOT to go through with the surgery, for Insertion of left thigh AVG on 6/28.  Requested to make Dr. Darrick PennaFields aware he has decided to keep the catheter in right thigh.  Spoke with pt's wife, upon returning call to pt.  Wife stated the pt. made this decision based on Dr. Darrick PennaFields telling him, that he would be at high risk, for getting an infection in the left thigh AVG.  Advised wife, will make Dr. Darrick PennaFields aware of his decision.

## 2015-11-08 ENCOUNTER — Encounter: Payer: Self-pay | Admitting: Vascular Surgery

## 2015-11-08 NOTE — Progress Notes (Signed)
Patient is a 35 year old male who returns today for follow-up after placement of a right thigh palindrome catheter as well as removal of a right forearm AV graft. The right forearm AV graft was removed for presumed infection as well as swelling secondary to central vein occlusions. All of the grafts were placed elsewhere. At this point the patient wants to consider a new permanent access. He has some reservations about placing prosthetic material because all of these have become infected in the early period after placement. 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 Chronic medical problems include hypertension and HIV both of which are currently stable    Past Medical History   Diagnosis  Date   .  Renal disorder     .  Hypertension     .  Immune deficiency disorder (HCC)     .  HIV (human immunodeficiency virus infection) (HCC)     .  GERD (gastroesophageal reflux disease)     .  Migraines     .  Parathyroid abnormality [facility](HCC)         Past Surgical History   Procedure  Laterality  Date   .  Av fistula placement       .  Esophagogastroduodenoscopy (egd) with propofol  N/A  09/23/2015       Procedure: ESOPHAGOGASTRODUODENOSCOPY (EGD) WITH PROPOFOL;  Surgeon: Graylin ShiverSalem F Ganem, MD;  [facility];  Service: Endoscopy;  Laterality: N/A;   .  Insertion of dialysis catheter  N/A  10/03/2015       Procedure: INSERTION OF DIALYSIS CATHETER RIGHT FEMORAL VEIN;  Surgeon: Sherren Kernsharles E Fields, MD;  [facility];  Service: Vascular;  Laterality: N/A;   .  Avgg removal  Right  10/05/2015       Procedure: REMOVAL OF ARTERIOVENOUS GORETEX GRAFT (AVGG) RIGHT FOREARM;  Surgeon: Sherren Kernsharles E Fields, MD;  [facility];  Service: Vascular;  Laterality: Right;       Current Outpatient Prescriptions on File Prior to Visit   Medication  Sig  Dispense  Refill   .  atorvastatin (LIPITOR) 20 MG tablet  Take 20 mg by mouth at bedtime.       .  busPIRone (BUSPAR) 10 MG tablet  Take 1 tablet (10 mg  total) by mouth 2 (two) times daily.  60 tablet  0   .  camphor-menthol (SARNA) lotion  Apply topically as needed for itching.  222 mL  0   .  cinacalcet (SENSIPAR) 60 MG tablet  Take 120 mg by mouth at bedtime.       Marland Kitchen.  efavirenz (SUSTIVA) 600 MG tablet  Take 1 tablet (600 mg total) by mouth at bedtime.  30 tablet  1   .  Ferric Citrate (AURYXIA) 1 GM 210 MG(Fe) TABS  Take 630 mg by mouth 3 (three) times daily with meals.       .  folic acid-vitamin b complex-vitamin c-selenium-zinc (DIALYVITE) 3 MG TABS tablet  Take 1 tablet by mouth daily.       .  hydrOXYzine (ATARAX/VISTARIL) 25 MG tablet  Take 1 tablet (25 mg total) by mouth 3 (three) times daily as needed for itching (refractory itching).  30 tablet  0   .  lamiVUDine (EPIVIR) 150 MG tablet  Take 1 tablet (150 mg total) by mouth 2 (two) times a week.  10 tablet  1   .  levETIRAcetam (KEPPRA) 500 MG tablet  Take 1 tablet (500 mg total) by mouth  2 (two) times daily.  60 tablet  2   .  Multiple Vitamins-Iron (MULTIVITAMINS WITH IRON) TABS tablet  Take 1 tablet by mouth daily.       .  ondansetron (ZOFRAN-ODT) 4 MG disintegrating tablet  Take 4 mg by mouth every 8 (eight) hours as needed for nausea or vomiting.       Marland Kitchen  oxyCODONE (OXY IR/ROXICODONE) 5 MG immediate release tablet  Take 1 tablet (5 mg total) by mouth every 4 (four) hours as needed for moderate pain or severe pain.  65 tablet  0   .  pantoprazole (PROTONIX) 20 MG tablet  Take 2 tablets (40 mg total) by mouth daily.  14 tablet  0   .  promethazine (PHENERGAN) 25 MG tablet  Take 25 mg by mouth every 6 (six) hours as needed for nausea or vomiting.       .  ranitidine (ZANTAC) 150 MG tablet  Take 1 tablet (150 mg total) by mouth daily.  90 tablet  1   .  sucralfate (CARAFATE) 1 g tablet  Take 1 tablet (1 g total) by mouth 2 (two) times daily.  14 tablet  0   .  SUMAtriptan (IMITREX) 100 MG tablet  Take 0.5-1 tablets by mouth at the onset of a headache. May repeat in 2 hours if headache  persists or recurs.  10 tablet  0   .  tenofovir (VIREAD) 300 MG tablet  Take 1 tablet (300 mg total) by mouth once a week.  30 tablet  1      No current facility-administered medications on file prior to visit.      Physical exam:    Filed Vitals:   11/04/15 1558  BP: 147/88  Pulse: 98  Temp: 98.6 F (37 C)  TempSrc: Oral  Resp: 20  Height:  (1.626 m)  Weight: 174 lb (78.926 kg)  SpO2: 97%    Right upper extremity: Right hand slightly more edematous than left well-healed incisions right forearm Lower extremities: 2+ dorsalis pedis pulses bilaterally 1+ left posterior tibial pulse absent right posterior tibial pulse, existing right femoral tunneled dialysis catheter.  Assessment: Patient with limited options for permanent access. Had lengthy discussion with the patient and his wife today regarding options which at this point I believe only are a left thigh AV graft. The patient expressed concerns about infection and I agreed with him that he is going to be very high risk for infection. However, we also discussed that a catheter is a risk for infection as well. At this point he has opted for placement of a left thigh AV graft.  Plan: Left thigh AV graft scheduled for 11/18/2015.  Fabienne Bruns, MD [facility] [place] Office: [phone] Pager: [phone]

## 2015-11-10 ENCOUNTER — Telehealth: Payer: Self-pay

## 2015-11-10 NOTE — Telephone Encounter (Signed)
Patient is planning on [place] back to [place]  next month and has requested his medical [facility] [place].  I explained he will need to sign a medical record  release prior and he has agreed to come to the office.   He asked me to look it up online and I found the following address:  [facility]                                                                                                              [address]                                                  [place] , [place]  [id number]          Phone: [phone]  Patient will need to confirm address prior to sending medical records .    Laurell Josephsammy  K Kirandeep Fariss, RN

## 2015-11-11 ENCOUNTER — Ambulatory Visit: Payer: Medicare Other | Admitting: *Deleted

## 2015-11-18 ENCOUNTER — Encounter (HOSPITAL_COMMUNITY): Admission: RE | Payer: Self-pay | Source: Ambulatory Visit

## 2015-11-18 ENCOUNTER — Ambulatory Visit (HOSPITAL_COMMUNITY): Admission: RE | Admit: 2015-11-18 | Payer: Medicare Other | Source: Ambulatory Visit | Admitting: Vascular Surgery

## 2015-11-18 SURGERY — INSERTION OF ARTERIOVENOUS (AV) GORE-TEX GRAFT THIGH
Anesthesia: Choice | Laterality: Left

## 2015-11-20 ENCOUNTER — Ambulatory Visit: Payer: Medicare Other | Admitting: *Deleted

## 2015-11-20 DIAGNOSIS — F4321 Adjustment disorder with depressed mood: Secondary | ICD-10-CM

## 2015-11-20 NOTE — BH Specialist Note (Signed)
Robert Bradshaw was present today for his scheduled appointment.  Patient was oriented times four but reeked with mariajuana odor.  Patient indicated that he "might" have used earlier this morning.  Patient shared that he smokes weed to remain calm around his wife who he is not getting along with these days.  Patient admitted that he is still in love with his ex girlfriend that broke up with him. Patient stated that he is trying to find transportation in order to pack his things and [place] back [place] where the ex girlfriend and his family live.  Counselor used reflective listening skills with patient.  Counselor provided support, objectivity, and encouragement accordingly. Counselor recommended that patient continue to meet with counselor to further process the decisions he needs to make in the near future.  Patient agreed that he was confused right now and did not know if he was coming or going. Patient thanked counselor and stated that he would like to meet regularly for awhile until he could resolve this.  Jenel LucksJodi Lemond Griffee, MA,LPC [facility]

## 2015-11-25 ENCOUNTER — Other Ambulatory Visit: Payer: Self-pay | Admitting: Family

## 2015-12-02 ENCOUNTER — Other Ambulatory Visit: Payer: Self-pay

## 2015-12-02 ENCOUNTER — Other Ambulatory Visit: Payer: Medicare Other

## 2015-12-02 DIAGNOSIS — B20 Human immunodeficiency virus [HIV] disease: Secondary | ICD-10-CM

## 2015-12-02 LAB — CBC WITH DIFFERENTIAL/PLATELET
BASOS ABS: 0 {cells}/uL (ref 0–200)
Basophils Relative: 0 %
EOS ABS: 260 {cells}/uL (ref 15–500)
Eosinophils Relative: 4 %
HEMATOCRIT: 37.9 % — AB (ref 38.5–50.0)
Hemoglobin: 12.5 g/dL — ABNORMAL LOW (ref 13.2–17.1)
LYMPHS PCT: 27 %
Lymphs Abs: 1755 cells/uL (ref 850–3900)
MCH: 29.1 pg (ref 27.0–33.0)
MCHC: 33 g/dL (ref 32.0–36.0)
MCV: 88.3 fL (ref 80.0–100.0)
MONO ABS: 455 {cells}/uL (ref 200–950)
MPV: 9.9 fL (ref 7.5–12.5)
Monocytes Relative: 7 %
NEUTROS PCT: 62 %
Neutro Abs: 4030 cells/uL (ref 1500–7800)
Platelets: 161 10*3/uL (ref 140–400)
RBC: 4.29 MIL/uL (ref 4.20–5.80)
RDW: 19.3 % — AB (ref 11.0–15.0)
WBC: 6.5 10*3/uL (ref 3.8–10.8)

## 2015-12-02 LAB — COMPREHENSIVE METABOLIC PANEL
ALBUMIN: 4.5 g/dL (ref 3.6–5.1)
ALT: 11 U/L (ref 9–46)
AST: 18 U/L (ref 10–40)
Alkaline Phosphatase: 311 U/L — ABNORMAL HIGH (ref 40–115)
BUN: 45 mg/dL — ABNORMAL HIGH (ref 7–25)
CALCIUM: 8.3 mg/dL — AB (ref 8.6–10.3)
CHLORIDE: 100 mmol/L (ref 98–110)
CO2: 20 mmol/L (ref 20–31)
Creat: 13.87 mg/dL — ABNORMAL HIGH (ref 0.60–1.35)
Glucose, Bld: 76 mg/dL (ref 65–99)
POTASSIUM: 3.9 mmol/L (ref 3.5–5.3)
SODIUM: 138 mmol/L (ref 135–146)
Total Bilirubin: 0.4 mg/dL (ref 0.2–1.2)
Total Protein: 8 g/dL (ref 6.1–8.1)

## 2015-12-02 NOTE — Addendum Note (Signed)
Addended byJimmy Picket: Robert Bradshaw, Robert Bradshaw on: 12/02/2015 11:15 AM   Modules accepted: Orders

## 2015-12-02 NOTE — Telephone Encounter (Signed)
Patient was calling to say pharmacy would not refill his Epivir and he was almost out.  He says there is medicine in the bottle but he is not able to remove it with the syringe.  I spoke with the pharmacy and it can not be refilled for 4 days.   Patient was advised to pour medication into a cup and pull up medication with syringe to ensure he is getting correct dose.  Patient will try this and call if he has problems.   Laurell Josephsammy K Ananya Mccleese, RN

## 2015-12-02 NOTE — Addendum Note (Signed)
Addended by: Jennet MaduroESTRIDGE, Trayquan Kolakowski D on: 12/02/2015 10:58 AM   Modules accepted: Orders

## 2015-12-03 ENCOUNTER — Telehealth: Payer: Self-pay

## 2015-12-03 LAB — RPR

## 2015-12-03 LAB — HIV-1 RNA ULTRAQUANT REFLEX TO GENTYP+
HIV 1 RNA Quant: <20 copies/mL (ref <20)
HIV-1 RNA Quant, Log: <1.3 Log copies/mL (ref <1.30)

## 2015-12-03 LAB — T-HELPER CELL (CD4) - (RCID CLINIC ONLY)
CD4 T CELL HELPER: 29 % — AB (ref 33–55)
CD4 T Cell Abs: 550 /uL (ref 400–2700)

## 2015-12-03 NOTE — Telephone Encounter (Signed)
He is a dialysis patient this is NOT a critical value for HIM! I wish the lab would bother to look at the last several values  Lab Results  Component Value Date   CREATININE 13.87* 12/02/2015   CREATININE 17.41* 10/07/2015   CREATININE 26.91* 10/06/2015

## 2015-12-03 NOTE — Telephone Encounter (Signed)
[facility] rep Revonda Standardllison called to notify Dr. Daiva EvesVan Dam of critical alert value. Creatinine value of 13.87, test repeated and verified. Information to be faxed as well. Results fowarded to Dr. Daiva EvesVan Dam. Nyah Shepherd,LPN

## 2015-12-07 ENCOUNTER — Telehealth: Payer: Self-pay | Admitting: *Deleted

## 2015-12-07 ENCOUNTER — Telehealth: Payer: Self-pay

## 2015-12-07 NOTE — Telephone Encounter (Signed)
rec'd phone call from pt's. Wife.  Reported she would like an appt. with Dr. Darrick PennaFields this week, due to signs of infection in the right Arm.  Reported he started getting black spots all over his body, and nausea over the weekend.  Reported the right arm is warm; denied any redness, open sores, or drainage.  Denied fever or chills.  Wife stated "I know he is starting to get another infection, because this is just how the symptoms were the last time."   Advised that Dr. Darrick PennaFields was out of the office all week.  Offered that an appt. Can be scheduled with another provider.  Wife verb. that the pt. will ONLY see Dr. Darrick PennaFields;  so he will keep his appt. on 7/27.  Wife stated she will take the pt. To the ER, if his symptoms worsen.

## 2015-12-07 NOTE — Telephone Encounter (Signed)
Patient vascular office called to advise that the patient wife called them concerned that the patient may be getting an infection again. She advised his arm is warm to the touch and he has been running a fever (no temp given). He also a dark splotchy rash on his body and she feels certain he has an infection. They advised her to let us know and if he can not get in with us quickly she needs to take him to the ED for evaluation. She also advised the patient will still have his stents placed for his dialysis as scheduled. Advised them will let the doctor know but that he is out of the office until next week and the patient has an appt 12/16/15.

## 2015-12-08 NOTE — Telephone Encounter (Signed)
Please send pt to ED asap

## 2015-12-08 NOTE — Telephone Encounter (Signed)
Patient wife was advised to take him to the ED.

## 2015-12-09 ENCOUNTER — Telehealth: Payer: Self-pay

## 2015-12-09 NOTE — Telephone Encounter (Signed)
Return call from Dr Darrick PennaFields office.  They have spoken with the patient's wife several times this week.  He was offered a work in visit and they refused . At this point patient has been told to go to the emergency room three times.  When I spoke to the patient he acted as if no one was communicating with him at all. I have found that not to be true.  I will follow up with Robert Bradshaw and advise him I am aware of the conversation with Dr Evelina DunField's office and support their decision.  If he is currently having symptoms he should seek care ASAP with a local emergency room.   The patient verbalized understanding the above.   Robert Josephsammy K Kaysen Deal, RN

## 2015-12-09 NOTE — Telephone Encounter (Signed)
Patient walked into clinic with complaint of pain and burning on right arm here graft was recently removed. He states he has tired to reach Dr Darrick PennaFields office and was told the doctor was on vacation. Patient says he was told by Dr Darrick PennaFields to call if there were any problems with this site.  I have advised pateint to go to the emergency room for evaluation.  I have also called Dr Evelina DunField's office and left message with triage nurse describing symptoms and asked to return call to patient.   Laurell Josephsammy K King, RN

## 2015-12-09 NOTE — Telephone Encounter (Signed)
Spoke to Tomasita Morrowammy King, RN at Dr. Clinton GallantVan Dam's office. We both have spoken to the patient's wife regarding pt going to the ED for evaluation of "black spots and possible infection in graft". Wife refused to take him and wanted to keep Dr. Darrick PennaFields' appt on 12-17-15.

## 2015-12-09 NOTE — Telephone Encounter (Signed)
Thanks folks.

## 2015-12-10 ENCOUNTER — Encounter: Payer: Self-pay | Admitting: Vascular Surgery

## 2015-12-16 ENCOUNTER — Telehealth: Payer: Self-pay | Admitting: *Deleted

## 2015-12-16 ENCOUNTER — Ambulatory Visit: Payer: Medicare Other | Admitting: Infectious Disease

## 2015-12-16 NOTE — Telephone Encounter (Signed)
Left message for the patient to call [facility] to make another appointment with Dr.  Daiva Eves.

## 2015-12-17 ENCOUNTER — Other Ambulatory Visit: Payer: Self-pay | Admitting: Physician Assistant

## 2015-12-17 ENCOUNTER — Ambulatory Visit (HOSPITAL_COMMUNITY)
Admission: RE | Admit: 2015-12-17 | Discharge: 2015-12-17 | Disposition: A | Discharge status: Home / Self Care | Payer: Medicare Other | Source: Ambulatory Visit | Attending: Vascular Surgery | Admitting: Vascular Surgery

## 2015-12-17 ENCOUNTER — Ambulatory Visit (INDEPENDENT_AMBULATORY_CARE_PROVIDER_SITE_OTHER): Payer: Medicare Other | Admitting: Vascular Surgery

## 2015-12-17 ENCOUNTER — Encounter: Payer: Self-pay | Admitting: Vascular Surgery

## 2015-12-17 VITALS — BP 114/72 | HR 132 | Temp 97.2°F | Resp 20 | Ht 64.0 in | Wt 171.0 lb

## 2015-12-17 DIAGNOSIS — N186 End stage renal disease: Secondary | ICD-10-CM | POA: Diagnosis present

## 2015-12-17 DIAGNOSIS — K219 Gastro-esophageal reflux disease without esophagitis: Secondary | ICD-10-CM | POA: Diagnosis not present

## 2015-12-17 DIAGNOSIS — I120 Hypertensive chronic kidney disease with stage 5 chronic kidney disease or end stage renal disease: Secondary | ICD-10-CM | POA: Insufficient documentation

## 2015-12-17 DIAGNOSIS — B20 Human immunodeficiency virus [HIV] disease: Secondary | ICD-10-CM | POA: Insufficient documentation

## 2015-12-17 DIAGNOSIS — E785 Hyperlipidemia, unspecified: Secondary | ICD-10-CM | POA: Insufficient documentation

## 2015-12-17 DIAGNOSIS — Z992 Dependence on renal dialysis: Secondary | ICD-10-CM

## 2015-12-17 DIAGNOSIS — E039 Hypothyroidism, unspecified: Secondary | ICD-10-CM | POA: Insufficient documentation

## 2015-12-17 NOTE — Progress Notes (Signed)
POST OPERATIVE OFFICE NOTE    CC:  F/u for surgery  HPI:  This is a 35 y.o. male who is s/p placement of 55 cm palindrome catheter right femoral vein followed by Removal of infected right forearm AV graft.  He has a complaint of right fore arm pain and states that his catheter is malfunctioning at HD sessions.  Slow run times.    No Known Allergies  Current Outpatient Prescriptions  Medication Sig Dispense Refill  . atorvastatin (LIPITOR) 20 MG tablet Take 20 mg by mouth at bedtime.    . busPIRone (BUSPAR) 10 MG tablet TAKE 1 TABLET BY MOUTH 2 TIMES DAILY. 60 tablet 0  . camphor-menthol (SARNA) lotion Apply topically as needed for itching. 222 mL 0  . cephALEXin (KEFLEX) 500 MG capsule Take 1 capsule (500 mg total) by mouth 2 (two) times daily. 60 capsule 2  . cinacalcet (SENSIPAR) 60 MG tablet Take 120 mg by mouth at bedtime.    . dolutegravir (TIVICAY) 50 MG tablet Take 1 tablet (50 mg total) by mouth daily. 30 tablet 11  . Ferric Citrate (AURYXIA) 1 GM 210 MG(Fe) TABS Take 630 mg by mouth 3 (three) times daily with meals.    . folic acid-vitamin b complex-vitamin c-selenium-zinc (DIALYVITE) 3 MG TABS tablet Take 1 tablet by mouth daily.    . hydrOXYzine (ATARAX/VISTARIL) 25 MG tablet Take 1 tablet (25 mg total) by mouth 3 (three) times daily as needed for itching (refractory itching). 30 tablet 0  . lamiVUDine (EPIVIR) 10 MG/ML solution Take 5 mLs (50 mg total) by mouth daily. 240 mL 12  . levETIRAcetam (KEPPRA) 500 MG tablet Take 1 tablet (500 mg total) by mouth 2 (two) times daily. 60 tablet 2  . Multiple Vitamins-Iron (MULTIVITAMINS WITH IRON) TABS tablet Take 1 tablet by mouth daily.    . ondansetron (ZOFRAN-ODT) 4 MG disintegrating tablet Take 4 mg by mouth every 8 (eight) hours as needed for nausea or vomiting.    . oxyCODONE (OXY IR/ROXICODONE) 5 MG immediate release tablet Take 1 tablet (5 mg total) by mouth every 4 (four) hours as needed for moderate pain or severe pain. 65  tablet 0  . pantoprazole (PROTONIX) 20 MG tablet TAKE 2 TABLETS BY MOUTH DAILY 180 tablet 1  . promethazine (PHENERGAN) 25 MG tablet Take 25 mg by mouth every 6 (six) hours as needed for nausea or vomiting.    . ranitidine (ZANTAC) 150 MG tablet Take 1 tablet (150 mg total) by mouth daily. 90 tablet 1  . sucralfate (CARAFATE) 1 g tablet TAKE 1 TABLET BY MOUTH TWICE A DAY 180 tablet 1  . SUMAtriptan (IMITREX) 100 MG tablet Take 0.5-1 tablets by mouth at the onset of a headache. May repeat in 2 hours if headache persists or recurs. 10 tablet 0  . tenofovir (VIREAD) 300 MG tablet Take 1 tablet (300 mg total) by mouth once a week. 30 tablet 11   No current facility-administered medications for this visit.      ROS:  See HPI  Physical Exam:  Vitals:   12/17/15 1606  BP: 114/72  Pulse: (!) 132  Resp: 20  Temp: 97.2 F (36.2 C)    Incision:  Well healed right forearm incision with palpable medial raised area about 4mm.  No erythema, no drainage or opening in the skin. Extremities:  Right femoral catheter placement with out signs of infection.  Duplex of forearm performed 12/17/2015 No fluid collection, no hematoma, No retained graft segment    Assessment/Plan:  This is a 35 y.o. male who is s/p: placement of 55 cm palindrome catheter right femoral vein followed by Removal of infected right forearm AV graft.   We will plan on HD femoral catheter exchange Mon. 12/21/2015 by Dr. Darrick Penna.  We will watch the forearm for now there was a 1 mm spot that may be retained graft material, but it is not infected.   Thomasena Edis, EMMA MAUREEN PA-C [facility]   Clinic MD:  Pt seen and examined with Dr. Darrick Penna  History and exam findings as above  #1 patient needs replacement of his femoral Diatek catheter so that he gets better flow with dialysis.  Scheduled for July 31.  #2 left forearm pain area of retained graft was a segment less than 1 cm and potentially not even graft material  I believe we will replace 1 pain for another if we tried to go fishing for this. Have discussed with patient if he wishes to have referral to pain management center for his left forearm we be willing to do that currently he wishes to just deal with the issue.   Fabienne Bruns, MD [facility] [place] Office: [phone] Pager: [phone]

## 2015-12-18 ENCOUNTER — Other Ambulatory Visit: Payer: Self-pay | Admitting: *Deleted

## 2015-12-18 ENCOUNTER — Other Ambulatory Visit: Payer: Self-pay

## 2015-12-18 ENCOUNTER — Encounter (HOSPITAL_COMMUNITY): Payer: Self-pay | Admitting: *Deleted

## 2015-12-18 MED ORDER — TAB-A-VITE/IRON PO TABS: 1.0000 | ORAL_TABLET | Freq: Every day | ORAL | 5 refills | Status: AC

## 2015-12-20 MED ORDER — CEFUROXIME SODIUM 1.5 G IJ SOLR
1.5000 g | INTRAMUSCULAR | Status: AC
  Administered 2015-12-21: 1.5 g via INTRAVENOUS
  Filled 2015-12-20: qty 1.5

## 2015-12-21 ENCOUNTER — Ambulatory Visit (HOSPITAL_COMMUNITY): Payer: Medicare Other | Admitting: Certified Registered Nurse Anesthetist

## 2015-12-21 ENCOUNTER — Ambulatory Visit (HOSPITAL_COMMUNITY): Payer: Medicare Other

## 2015-12-21 ENCOUNTER — Ambulatory Visit (HOSPITAL_COMMUNITY)
Admission: RE | Admit: 2015-12-21 | Discharge: 2015-12-21 | Disposition: A | Discharge status: Home / Self Care | Payer: Medicare Other | Source: Ambulatory Visit | Attending: Vascular Surgery | Admitting: Vascular Surgery

## 2015-12-21 ENCOUNTER — Encounter (HOSPITAL_COMMUNITY): Payer: Self-pay | Admitting: *Deleted

## 2015-12-21 ENCOUNTER — Encounter (HOSPITAL_COMMUNITY)
Admission: RE | Disposition: A | Discharge status: Home / Self Care | Payer: Self-pay | Source: Ambulatory Visit | Attending: Vascular Surgery

## 2015-12-21 DIAGNOSIS — K219 Gastro-esophageal reflux disease without esophagitis: Secondary | ICD-10-CM | POA: Insufficient documentation

## 2015-12-21 DIAGNOSIS — N186 End stage renal disease: Secondary | ICD-10-CM | POA: Diagnosis not present

## 2015-12-21 DIAGNOSIS — Z95828 Presence of other vascular implants and grafts: Secondary | ICD-10-CM

## 2015-12-21 DIAGNOSIS — E039 Hypothyroidism, unspecified: Secondary | ICD-10-CM | POA: Insufficient documentation

## 2015-12-21 DIAGNOSIS — Z419 Encounter for procedure for purposes other than remedying health state, unspecified: Secondary | ICD-10-CM

## 2015-12-21 DIAGNOSIS — Z992 Dependence on renal dialysis: Secondary | ICD-10-CM

## 2015-12-21 DIAGNOSIS — I120 Hypertensive chronic kidney disease with stage 5 chronic kidney disease or end stage renal disease: Secondary | ICD-10-CM | POA: Diagnosis present

## 2015-12-21 DIAGNOSIS — N185 Chronic kidney disease, stage 5: Secondary | ICD-10-CM

## 2015-12-21 HISTORY — DX: Other specified postprocedural states: Z98.890

## 2015-12-21 HISTORY — DX: Other specified postprocedural states: R11.2

## 2015-12-21 HISTORY — PX: EXCHANGE OF A DIALYSIS CATHETER: SHX5818

## 2015-12-21 HISTORY — DX: Chronic kidney disease, unspecified: N18.9

## 2015-12-21 LAB — POCT I-STAT 4, (NA,K, GLUC, HGB,HCT)
Glucose, Bld: 74 mg/dL (ref 65–99)
HCT: 40 % (ref 39.0–52.0)
Hemoglobin: 13.6 g/dL (ref 13.0–17.0)
Potassium: 4.5 mmol/L (ref 3.5–5.1)
Sodium: 140 mmol/L (ref 135–145)

## 2015-12-21 SURGERY — EXCHANGE OF A DIALYSIS CATHETER
Anesthesia: General | Site: Groin | Laterality: Right

## 2015-12-21 MED ORDER — IOPAMIDOL (ISOVUE-300) INJECTION 61%
INTRAVENOUS | Status: AC
  Filled 2015-12-21: qty 50

## 2015-12-21 MED ORDER — OXYCODONE-ACETAMINOPHEN 5-325 MG PO TABS: 1.0000 | ORAL_TABLET | Freq: Four times a day (QID) | ORAL | 0 refills | Status: AC | PRN

## 2015-12-21 MED ORDER — ALBUMIN HUMAN 5 % IV SOLN
INTRAVENOUS | Status: DC | PRN
  Administered 2015-12-21: 12:00:00 via INTRAVENOUS

## 2015-12-21 MED ORDER — MIDAZOLAM HCL 5 MG/5ML IJ SOLN
INTRAMUSCULAR | Status: DC | PRN
  Administered 2015-12-21: 2 mg via INTRAVENOUS

## 2015-12-21 MED ORDER — PHENYLEPHRINE 40 MCG/ML (10ML) SYRINGE FOR IV PUSH (FOR BLOOD PRESSURE SUPPORT)
PREFILLED_SYRINGE | INTRAVENOUS | Status: AC
  Filled 2015-12-21: qty 10

## 2015-12-21 MED ORDER — HYDROMORPHONE HCL 1 MG/ML IJ SOLN
INTRAMUSCULAR | Status: AC
  Administered 2015-12-21: 0.5 mg via INTRAVENOUS
  Filled 2015-12-21: qty 1

## 2015-12-21 MED ORDER — OXYCODONE-ACETAMINOPHEN 5-325 MG PO TABS: 1.0000 | ORAL_TABLET | Freq: Four times a day (QID) | ORAL | 0 refills | Status: DC | PRN

## 2015-12-21 MED ORDER — OXYCODONE-ACETAMINOPHEN 5-325 MG PO TABS
ORAL_TABLET | ORAL | Status: AC
  Filled 2015-12-21: qty 1

## 2015-12-21 MED ORDER — FENTANYL CITRATE (PF) 100 MCG/2ML IJ SOLN
INTRAMUSCULAR | Status: DC | PRN
  Administered 2015-12-21: 50 ug via INTRAVENOUS
  Administered 2015-12-21 (×2): 25 ug via INTRAVENOUS
  Administered 2015-12-21: 50 ug via INTRAVENOUS

## 2015-12-21 MED ORDER — 0.9 % SODIUM CHLORIDE (POUR BTL) OPTIME: TOPICAL | Status: DC

## 2015-12-21 MED ORDER — PROTAMINE SULFATE 10 MG/ML IV SOLN
INTRAVENOUS | Status: AC
  Filled 2015-12-21: qty 5

## 2015-12-21 MED ORDER — LIDOCAINE 2% (20 MG/ML) 5 ML SYRINGE
INTRAMUSCULAR | Status: AC
  Filled 2015-12-21: qty 5

## 2015-12-21 MED ORDER — ONDANSETRON HCL 4 MG/2ML IJ SOLN
INTRAMUSCULAR | Status: DC | PRN
  Administered 2015-12-21: 4 mg via INTRAVENOUS

## 2015-12-21 MED ORDER — HYDROMORPHONE HCL 1 MG/ML IJ SOLN
0.2500 mg | INTRAMUSCULAR | Status: DC | PRN
  Administered 2015-12-21 (×3): 0.5 mg via INTRAVENOUS

## 2015-12-21 MED ORDER — PHENYLEPHRINE HCL 10 MG/ML IJ SOLN
INTRAMUSCULAR | Status: DC | PRN
  Administered 2015-12-21 (×3): 80 ug via INTRAVENOUS

## 2015-12-21 MED ORDER — ONDANSETRON HCL 4 MG/2ML IJ SOLN
INTRAMUSCULAR | Status: AC
  Filled 2015-12-21: qty 2

## 2015-12-21 MED ORDER — OXYCODONE-ACETAMINOPHEN 5-325 MG PO TABS
1.0000 | ORAL_TABLET | ORAL | Status: AC | PRN
  Administered 2015-12-21: 1 via ORAL

## 2015-12-21 MED ORDER — CHLORHEXIDINE GLUCONATE CLOTH 2 % EX PADS: 6.0000 | MEDICATED_PAD | Freq: Once | CUTANEOUS | Status: DC

## 2015-12-21 MED ORDER — SODIUM CHLORIDE 0.9 % IV SOLN
INTRAVENOUS | Status: DC | PRN
  Administered 2015-12-21: 500 mL

## 2015-12-21 MED ORDER — LIDOCAINE HCL (PF) 1 % IJ SOLN
INTRAMUSCULAR | Status: DC | PRN
  Administered 2015-12-21: 30 mL

## 2015-12-21 MED ORDER — LIDOCAINE HCL (CARDIAC) 20 MG/ML IV SOLN
INTRAVENOUS | Status: DC | PRN
  Administered 2015-12-21: 100 mg via INTRAVENOUS

## 2015-12-21 MED ORDER — LIDOCAINE HCL (PF) 1 % IJ SOLN
INTRAMUSCULAR | Status: AC
  Filled 2015-12-21: qty 30

## 2015-12-21 MED ORDER — HEPARIN SODIUM (PORCINE) 1000 UNIT/ML IJ SOLN
INTRAMUSCULAR | Status: AC
  Filled 2015-12-21: qty 1

## 2015-12-21 MED ORDER — SODIUM CHLORIDE 0.9 % IV SOLN
INTRAVENOUS | Status: DC
  Administered 2015-12-21 (×3): via INTRAVENOUS

## 2015-12-21 MED ORDER — MIDAZOLAM HCL 2 MG/2ML IJ SOLN
INTRAMUSCULAR | Status: AC
  Filled 2015-12-21: qty 2

## 2015-12-21 MED ORDER — DEXAMETHASONE SODIUM PHOSPHATE 10 MG/ML IJ SOLN
INTRAMUSCULAR | Status: AC
  Filled 2015-12-21: qty 1

## 2015-12-21 MED ORDER — HEPARIN SODIUM (PORCINE) 1000 UNIT/ML IJ SOLN
INTRAMUSCULAR | Status: DC | PRN
  Administered 2015-12-21: 6 mL

## 2015-12-21 MED ORDER — DEXAMETHASONE SODIUM PHOSPHATE 10 MG/ML IJ SOLN
INTRAMUSCULAR | Status: DC | PRN
Start: 2015-12-21 — End: 2015-12-21
  Administered 2015-12-21: 10 mg via INTRAVENOUS

## 2015-12-21 MED ORDER — PROPOFOL 10 MG/ML IV BOLUS
INTRAVENOUS | Status: DC | PRN
  Administered 2015-12-21: 180 mg via INTRAVENOUS

## 2015-12-21 MED ORDER — FENTANYL CITRATE (PF) 250 MCG/5ML IJ SOLN
INTRAMUSCULAR | Status: AC
  Filled 2015-12-21: qty 5

## 2015-12-21 MED ORDER — HYDROMORPHONE HCL 1 MG/ML IJ SOLN
INTRAMUSCULAR | Status: AC
  Filled 2015-12-21: qty 1

## 2015-12-21 MED ORDER — HEPARIN SODIUM (PORCINE) 1000 UNIT/ML IJ SOLN
INTRAMUSCULAR | Status: AC
  Filled 2015-12-21: qty 2

## 2015-12-21 MED ORDER — PROPOFOL 10 MG/ML IV BOLUS
INTRAVENOUS | Status: AC
  Filled 2015-12-21: qty 20

## 2015-12-21 SURGICAL SUPPLY — 35 items
BAG DECANTER FOR FLEXI CONT (MISCELLANEOUS) ×2 IMPLANT
BIOPATCH RED 1 DISK 7.0 (GAUZE/BANDAGES/DRESSINGS) ×2 IMPLANT
CATH PALINDROME RT-P 15FX19CM (CATHETERS) IMPLANT
CATH PALINDROME RT-P 15FX23CM (CATHETERS) IMPLANT
CATH PALINDROME RT-P 15FX28CM (CATHETERS) IMPLANT
CATH PALINDROME RT-P 15FX55CM (CATHETERS) ×2 IMPLANT
CHLORAPREP W/TINT 26ML (MISCELLANEOUS) ×4 IMPLANT
COVER PROBE W GEL 5X96 (DRAPES) ×2 IMPLANT
DECANTER SPIKE VIAL GLASS SM (MISCELLANEOUS) ×2 IMPLANT
DRAPE C-ARM 42X72 X-RAY (DRAPES) ×2 IMPLANT
DRAPE CHEST BREAST 15X10 FENES (DRAPES) ×2 IMPLANT
DRSG COVADERM 4X6 (GAUZE/BANDAGES/DRESSINGS) ×2 IMPLANT
GAUZE SPONGE 4X4 16PLY XRAY LF (GAUZE/BANDAGES/DRESSINGS) IMPLANT
GLOVE BIO SURGEON STRL SZ 6.5 (GLOVE) ×2 IMPLANT
GLOVE BIO SURGEON STRL SZ7.5 (GLOVE) ×2 IMPLANT
GLOVE BIOGEL PI IND STRL 6.5 (GLOVE) ×3 IMPLANT
GLOVE BIOGEL PI INDICATOR 6.5 (GLOVE) ×3
GOWN STRL REUS W/ TWL LRG LVL3 (GOWN DISPOSABLE) ×3 IMPLANT
GOWN STRL REUS W/TWL LRG LVL3 (GOWN DISPOSABLE) ×3
KIT BASIN OR (CUSTOM PROCEDURE TRAY) ×2 IMPLANT
KIT ROOM TURNOVER OR (KITS) ×2 IMPLANT
LIQUID BAND (GAUZE/BANDAGES/DRESSINGS) ×2 IMPLANT
NEEDLE 18GX1X1/2 (RX/OR ONLY) (NEEDLE) ×2 IMPLANT
NEEDLE HYPO 25GX1X1/2 BEV (NEEDLE) ×2 IMPLANT
NS IRRIG 1000ML POUR BTL (IV SOLUTION) ×2 IMPLANT
PACK SURGICAL SETUP 50X90 (CUSTOM PROCEDURE TRAY) ×2 IMPLANT
PAD ARMBOARD 7.5X6 YLW CONV (MISCELLANEOUS) ×4 IMPLANT
SUT ETHILON 3 0 PS 1 (SUTURE) ×2 IMPLANT
SUT VICRYL 4-0 PS2 18IN ABS (SUTURE) ×2 IMPLANT
SYR 20CC LL (SYRINGE) ×4 IMPLANT
SYR 30ML LL (SYRINGE) IMPLANT
SYR 5ML LL (SYRINGE) ×4 IMPLANT
SYR CONTROL 10ML LL (SYRINGE) ×2 IMPLANT
SYRINGE 10CC LL (SYRINGE) ×2 IMPLANT
WATER STERILE IRR 1000ML POUR (IV SOLUTION) IMPLANT

## 2015-12-21 NOTE — Progress Notes (Addendum)
Spoke to Dr. Darrick Penna. Relayed xray results also Mrs. Marone's concern for patient pain level and not having a prescription for home. Wife spoke to Dr. Darrick Penna via phone. Prescription for percocet 6 tabs to be written

## 2015-12-21 NOTE — Transfer of Care (Signed)
Immediate Anesthesia Transfer of Care Note  Patient: Robert Bradshaw  Procedure(s) Performed: Procedure(s): EXCHANGE OF A  RIGHT FEMORAL DIALYSIS CATHETER (Right)  Patient Location: PACU  Anesthesia Type:General  Level of Consciousness: awake, alert  and oriented  Airway & Oxygen Therapy: Patient Spontanous Breathing and Patient connected to nasal cannula oxygen  Post-op Assessment: Report given to RN, Post -op Vital signs reviewed and stable and Patient moving all extremities X 4  Post vital signs: Reviewed  Last Vitals:  Vitals:   12/21/15 1110 12/21/15 1229  BP: (!) 135/96 (!) 139/99  Pulse: 66 78  Resp: 20 16  Temp: 36.4 C 36.4 C    Last Pain:  Vitals:   12/21/15 1110  TempSrc: Oral      Patients Stated Pain Goal: 1 (12/21/15 1041)  Complications: No apparent anesthesia complications

## 2015-12-21 NOTE — Anesthesia Preprocedure Evaluation (Signed)
Anesthesia Evaluation  Patient identified by MRN, date of birth, ID band Patient awake    History of Anesthesia Complications (+) PONV  Airway Mallampati: II  TM Distance: >3 FB Neck ROM: Full    Dental   Pulmonary Current Smoker,    breath sounds clear to auscultation       Cardiovascular hypertension,  Rhythm:Regular Rate:Normal     Neuro/Psych  Headaches, Seizures -,     GI/Hepatic Neg liver ROS, GERD  ,  Endo/Other  Hypothyroidism   Renal/GU Renal disease     Musculoskeletal   Abdominal   Peds  Hematology   Anesthesia Other Findings   Reproductive/Obstetrics                             Anesthesia Physical Anesthesia Plan  ASA: III  Anesthesia Plan: General   Post-op Pain Management:    Induction: Intravenous  Airway Management Planned: LMA  Additional Equipment:   Intra-op Plan:   Post-operative Plan: Extubation in OR  Informed Consent: I have reviewed the patients History and Physical, chart, labs and discussed the procedure including the risks, benefits and alternatives for the proposed anesthesia with the patient or authorized representative who has indicated his/her understanding and acceptance.   Dental advisory given  Plan Discussed with: Anesthesiologist  Anesthesia Plan Comments:         Anesthesia Quick Evaluation

## 2015-12-21 NOTE — H&P (View-Only) (Signed)
POST OPERATIVE OFFICE NOTE    CC:  F/u for surgery  HPI:  This is a 35 y.o. male who is s/p placement of 55 cm palindrome catheter right femoral vein followed by Removal of infected right forearm AV graft.  He has a complaint of right fore arm pain and states that his catheter is malfunctioning at HD sessions.  Slow run times.    No Known Allergies  Current Outpatient Prescriptions  Medication Sig Dispense Refill  . atorvastatin (LIPITOR) 20 MG tablet Take 20 mg by mouth at bedtime.    . busPIRone (BUSPAR) 10 MG tablet TAKE 1 TABLET BY MOUTH 2 TIMES DAILY. 60 tablet 0  . camphor-menthol (SARNA) lotion Apply topically as needed for itching. 222 mL 0  . cephALEXin (KEFLEX) 500 MG capsule Take 1 capsule (500 mg total) by mouth 2 (two) times daily. 60 capsule 2  . cinacalcet (SENSIPAR) 60 MG tablet Take 120 mg by mouth at bedtime.    . dolutegravir (TIVICAY) 50 MG tablet Take 1 tablet (50 mg total) by mouth daily. 30 tablet 11  . Ferric Citrate (AURYXIA) 1 GM 210 MG(Fe) TABS Take 630 mg by mouth 3 (three) times daily with meals.    . folic acid-vitamin b complex-vitamin c-selenium-zinc (DIALYVITE) 3 MG TABS tablet Take 1 tablet by mouth daily.    . hydrOXYzine (ATARAX/VISTARIL) 25 MG tablet Take 1 tablet (25 mg total) by mouth 3 (three) times daily as needed for itching (refractory itching). 30 tablet 0  . lamiVUDine (EPIVIR) 10 MG/ML solution Take 5 mLs (50 mg total) by mouth daily. 240 mL 12  . levETIRAcetam (KEPPRA) 500 MG tablet Take 1 tablet (500 mg total) by mouth 2 (two) times daily. 60 tablet 2  . Multiple Vitamins-Iron (MULTIVITAMINS WITH IRON) TABS tablet Take 1 tablet by mouth daily.    . ondansetron (ZOFRAN-ODT) 4 MG disintegrating tablet Take 4 mg by mouth every 8 (eight) hours as needed for nausea or vomiting.    Marland Kitchen oxyCODONE (OXY IR/ROXICODONE) 5 MG immediate release tablet Take 1 tablet (5 mg total) by mouth every 4 (four) hours as needed for moderate pain or severe pain. 65  tablet 0  . pantoprazole (PROTONIX) 20 MG tablet TAKE 2 TABLETS BY MOUTH DAILY 180 tablet 1  . promethazine (PHENERGAN) 25 MG tablet Take 25 mg by mouth every 6 (six) hours as needed for nausea or vomiting.    . ranitidine (ZANTAC) 150 MG tablet Take 1 tablet (150 mg total) by mouth daily. 90 tablet 1  . sucralfate (CARAFATE) 1 g tablet TAKE 1 TABLET BY MOUTH TWICE A DAY 180 tablet 1  . SUMAtriptan (IMITREX) 100 MG tablet Take 0.5-1 tablets by mouth at the onset of a headache. May repeat in 2 hours if headache persists or recurs. 10 tablet 0  . tenofovir (VIREAD) 300 MG tablet Take 1 tablet (300 mg total) by mouth once a week. 30 tablet 11   No current facility-administered medications for this visit.      ROS:  See HPI  Physical Exam:  Vitals:   12/17/15 1606  BP: 114/72  Pulse: (!) 132  Resp: 20  Temp: 97.2 F (36.2 C)    Incision:  Well healed right forearm incision with palpable medial raised area about 4mm.  No erythema, no drainage or opening in the skin. Extremities:  Right femoral catheter placement with out signs of infection.  Duplex of forearm performed 12/17/2015 No fluid collection, no hematoma, No retained graft segment  Assessment/Plan:  This is a 35 y.o. male who is s/p: placement of 55 cm palindrome catheter right femoral vein followed by Removal of infected right forearm AV graft.   We will plan on HD femoral catheter exchange Mon. 12/21/2015 by Dr. Darrick Penna.  We will watch the forearm for now there was a 1 mm spot that may be retained graft material, but it is not infected.   Thomasena Edis, EMMA MAUREEN PA-C [facility]   Clinic MD:  Pt seen and examined with Dr. Darrick Penna  History and exam findings as above  #1 patient needs replacement of his femoral Diatek catheter so that he gets better flow with dialysis.  Scheduled for July 31.  #2 left forearm pain area of retained graft was a segment less than 1 cm and potentially not even graft material  I believe we will replace 1 pain for another if we tried to go fishing for this. Have discussed with patient if he wishes to have referral to pain management center for his left forearm we be willing to do that currently he wishes to just deal with the issue.   Fabienne Bruns, MD [facility] [place] Office: [phone] Pager: [phone]

## 2015-12-21 NOTE — Anesthesia Postprocedure Evaluation (Signed)
Anesthesia Post Note  Patient: Robert Bradshaw  Procedure(s) Performed: Procedure(s) (LRB): EXCHANGE OF A  RIGHT FEMORAL DIALYSIS CATHETER (Right)  Patient location during evaluation: PACU Anesthesia Type: General Level of consciousness: awake Pain management: pain level controlled Respiratory status: spontaneous breathing Cardiovascular status: stable Anesthetic complications: no    Last Vitals:  Vitals:   12/21/15 1315 12/21/15 1338  BP: (!) 128/97 (!) 127/94  Pulse:  77  Resp:  18  Temp: 36.4 C     Last Pain:  Vitals:   12/21/15 1431  TempSrc:   PainSc: Asleep                 EDWARDS,Fahima Cifelli

## 2015-12-21 NOTE — Interval H&P Note (Signed)
History and Physical Interval Note:  12/21/2015 10:57 AM  Robert Bradshaw  has presented today for surgery, with the diagnosis of End Stage Renal Disease N18.6  The various methods of treatment have been discussed with the patient and family. After consideration of risks, benefits and other options for treatment, the patient has consented to  Procedure(s): EXCHANGE OF A FEMORAL DIALYSIS CATHETER (Right) as a surgical intervention .  The patient's history has been reviewed, patient examined, no change in status, stable for surgery.  I have reviewed the patient's chart and labs.  Questions were answered to the patient's satisfaction.     Fabienne Bruns

## 2015-12-21 NOTE — Progress Notes (Signed)
Patient c/o pain 10/10 at surgical site. Dilaudid 1.5 mg total received for pain since admission and "it's not helping" . Cold applied to rt upper thigh for comfort. Pt sleepy. V V Kathie Rhodes Dr.. Fields notified and verification of no pain prescription written. Dr. Darrick Penna feels that Extra Strength Tylenol should be used after discharge and he had spoken with Mr. Lutz wife.

## 2015-12-21 NOTE — Anesthesia Procedure Notes (Signed)
Procedure Name: LMA Insertion Date/Time: 12/21/2015 11:31 AM Performed by: Reine Just Pre-anesthesia Checklist: Patient identified, Emergency Drugs available, Suction available, Patient being monitored and Timeout performed Patient Re-evaluated:Patient Re-evaluated prior to inductionOxygen Delivery Method: Circle system utilized and Simple face mask Preoxygenation: Pre-oxygenation with 100% oxygen Intubation Type: Combination inhalational/ intravenous induction Ventilation: Mask ventilation without difficulty LMA: LMA inserted LMA Size: 5.0 Number of attempts: 1 Airway Equipment and Method: Patient positioned with wedge pillow Placement Confirmation: positive ETCO2 and breath sounds checked- equal and bilateral Tube secured with: Tape Dental Injury: Teeth and Oropharynx as per pre-operative assessment

## 2015-12-21 NOTE — Op Note (Signed)
Procedure: Ultrasound-guided insertion of right femoral Diatek catheter         Removal right femoral Diatek catheter  Preoperative diagnosis: End-stage renal disease  Postoperative diagnosis: Same  Anesthesia: General  Operative findings: 55 cm Diatek catheter right femoral vein  Specimens: tip of diatek for culture  Operative details: After obtaining informed consent, the patient was taken to the operating room. The patient was placed in supine position on the operating room table. After administration of general anesthesia, both groins were prepped and draped in usual sterile fashion. The patient was placed in reverse Trendelenburg position. Ultrasound was used to identify the patient's right femoral vein. This had normal compressibility and respiratory variation. Local anesthesia was infiltrated over the right femoral vein.  Using ultrasound guidance, the right femoral vein was successfully cannulated.  A 0.035 J-tipped guidewire was threaded into the right femoral vein and into the inferior vena cava followed by the right atrium under fluoroscopic guidance.   Next sequential 12 and 14 dilators were placed over the guidewire into the right femoral vein.  A 16 French dilator with a peel-away sheath was then placed over the guidewire into the right femoral vein.   The guidewire and dilator were removed. A 55 cm Diatek catheter was then placed through the peel away sheath into the right atrium.  The catheter was then tunneled subcutaneously, cut to length, and the hub attached. The catheter was noted to flush and draw easily. The catheter was inspected under fluoroscopy and found with its tip to be in the right atrium without any kinks throughout its course. The catheter was sutured to the skin with nylon sutures. The groin insertion site was closed with Vicryl stitch. The catheter was then loaded with concentrated Heparin solution. A dry sterile dressing was applied. Next, the suture on the right  anterior thigh for the old femoral catheter was clipped and the preexisting Diatek removed with gentle traction.  Hemostasis was obtained with 5 minutes of direct pressure.  The exit site was closed with a vicryl stitch. Dermabond was applied as well as a sterile dressing.   The patient tolerated procedure well and there were no complications. Instrument sponge and needle counts were correct at the end of the case. The patient was taken to the recovery room in stable condition. Chest x-ray will be obtained in the recovery room.The catheter tip of the old catheter was sent for culture.  Fabienne Bruns, MD [facility] Office: [phone] Pager: [phone]

## 2015-12-22 ENCOUNTER — Encounter: Payer: Self-pay | Admitting: *Deleted

## 2015-12-22 ENCOUNTER — Encounter (HOSPITAL_COMMUNITY): Payer: Self-pay | Admitting: Vascular Surgery

## 2015-12-24 LAB — CATH TIP CULTURE: Culture: NO GROWTH

## 2016-01-01 ENCOUNTER — Institutional Professional Consult (permissible substitution): Payer: Medicare Other | Admitting: Pulmonary Disease

## 2016-01-04 ENCOUNTER — Other Ambulatory Visit: Payer: Self-pay | Admitting: Family

## 2016-01-04 DIAGNOSIS — K2100 Gastro-esophageal reflux disease with esophagitis, without bleeding: Secondary | ICD-10-CM

## 2016-01-04 DIAGNOSIS — R569 Unspecified convulsions: Secondary | ICD-10-CM

## 2016-01-12 ENCOUNTER — Telehealth: Payer: Self-pay | Admitting: *Deleted

## 2016-01-12 NOTE — Telephone Encounter (Signed)
Left message with [facility] information, [place]. [facility]  [address] [place]  [id number] Phone: [phone]  Pt returned call and above information was shared.

## 2016-03-16 ENCOUNTER — Ambulatory Visit: Payer: Medicare Other | Admitting: Neurology

## 2016-09-19 ENCOUNTER — Other Ambulatory Visit: Payer: Self-pay | Admitting: Family

## 2016-10-31 ENCOUNTER — Other Ambulatory Visit: Payer: Self-pay | Admitting: Infectious Disease

## 2016-11-14 ENCOUNTER — Telehealth: Payer: Self-pay | Admitting: *Deleted

## 2016-11-14 ENCOUNTER — Other Ambulatory Visit: Payer: Self-pay | Admitting: Family

## 2016-11-14 DIAGNOSIS — R569 Unspecified convulsions: Secondary | ICD-10-CM

## 2016-11-15 ENCOUNTER — Other Ambulatory Visit: Payer: Self-pay

## 2016-11-15 MED ORDER — DOLUTEGRAVIR SODIUM 50 MG PO TABS: 50.0000 mg | ORAL_TABLET | Freq: Every day | ORAL | 11 refills | Status: AC

## 2016-11-17 NOTE — Telephone Encounter (Signed)
Unable to reach patient to discuss refill of HIV medication.  Patient requesting refill, has been over a year for MD appt.

## 2016-11-17 NOTE — Telephone Encounter (Signed)
Robert Bradshaw last phone note indicated he moved to [place]? May want to call that phone number to see what is going on and perhaps they or pharmacy have updated contact phone numberr?

## 2017-01-19 ENCOUNTER — Other Ambulatory Visit: Payer: Self-pay | Admitting: Family

## 2017-01-19 DIAGNOSIS — R569 Unspecified convulsions: Secondary | ICD-10-CM

## 2017-02-13 ENCOUNTER — Other Ambulatory Visit: Payer: Self-pay | Admitting: Infectious Disease

## 2017-02-13 ENCOUNTER — Telehealth: Payer: Self-pay | Admitting: *Deleted

## 2017-02-13 NOTE — Telephone Encounter (Signed)
Patient lives in [place], he is in care there. He will contact his provider there for refills. Andree Coss, RN

## 2017-03-20 ENCOUNTER — Other Ambulatory Visit: Payer: Self-pay | Admitting: Infectious Disease

## 2017-03-27 ENCOUNTER — Other Ambulatory Visit: Payer: Self-pay | Admitting: Infectious Disease

## 2017-04-26 IMAGING — CR DG CHEST 2V
2 series · 2 of 2 positions shown · non-contrast
Comparison: None.

CLINICAL DATA: Cough, vomiting bright red blood

EXAM:
CHEST  2 VIEW

[chest pa]
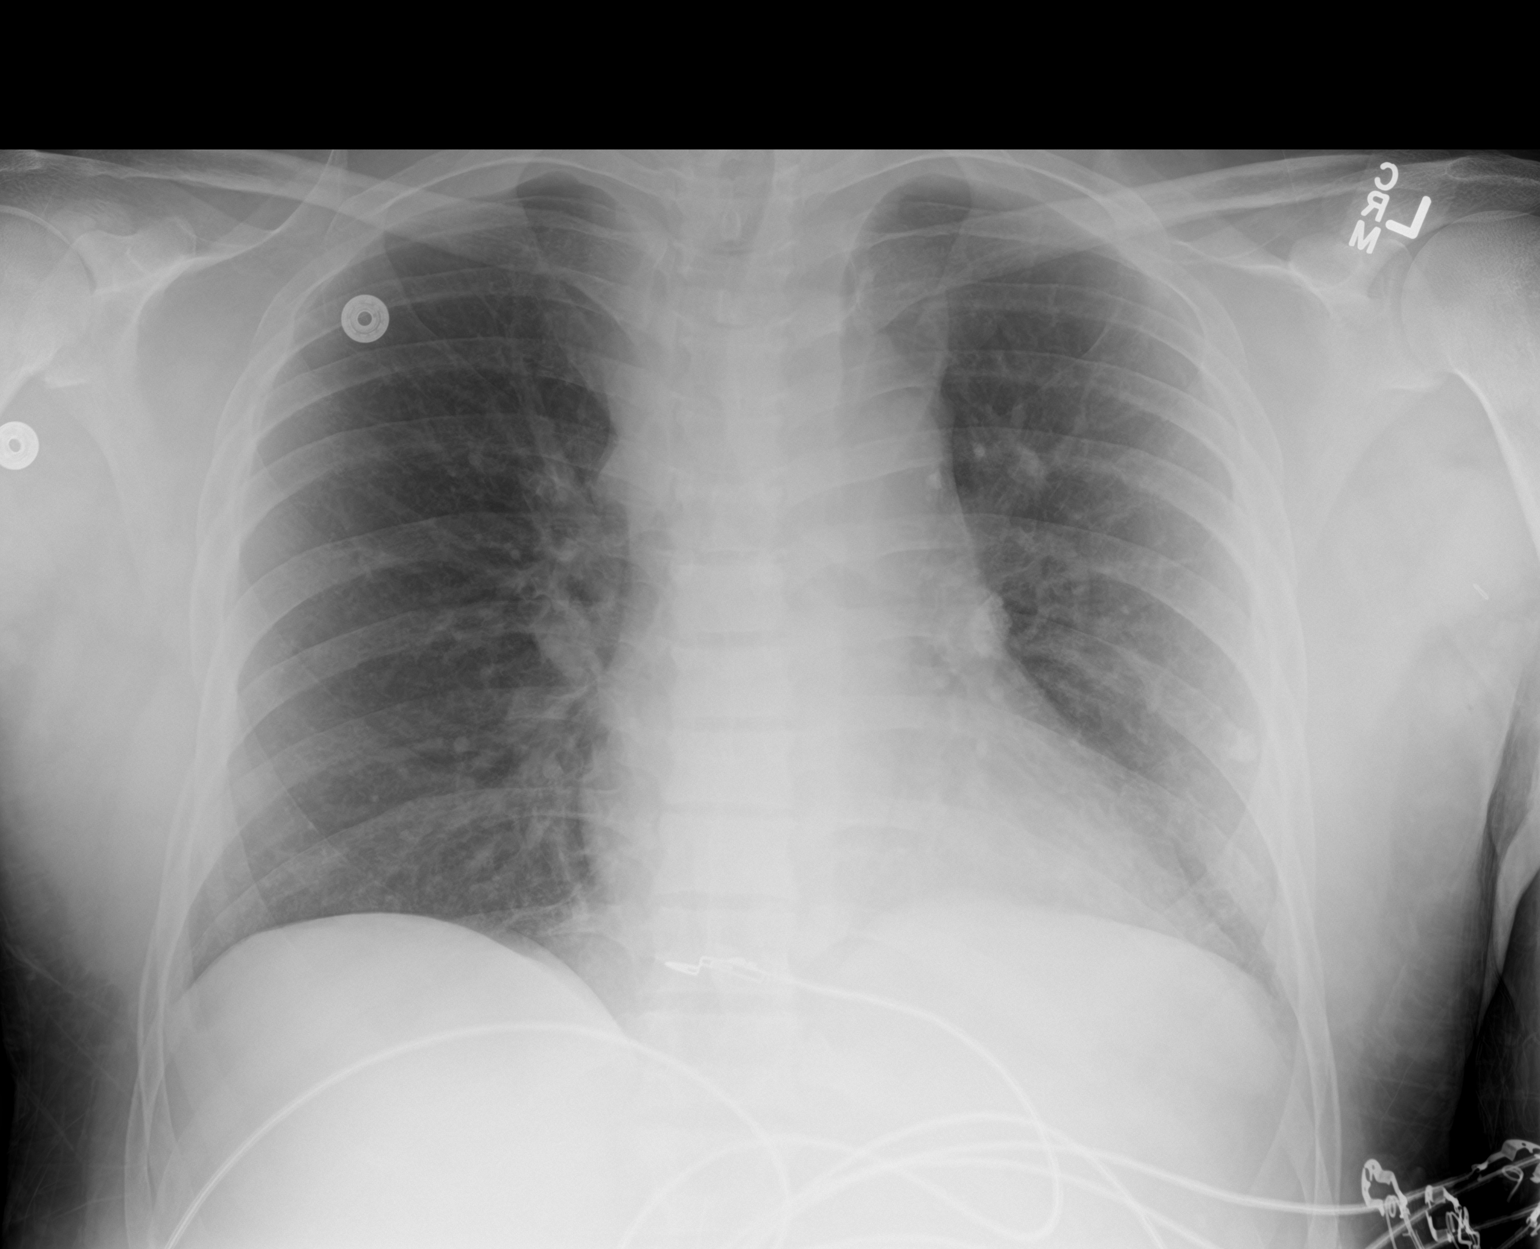

[chest lat]
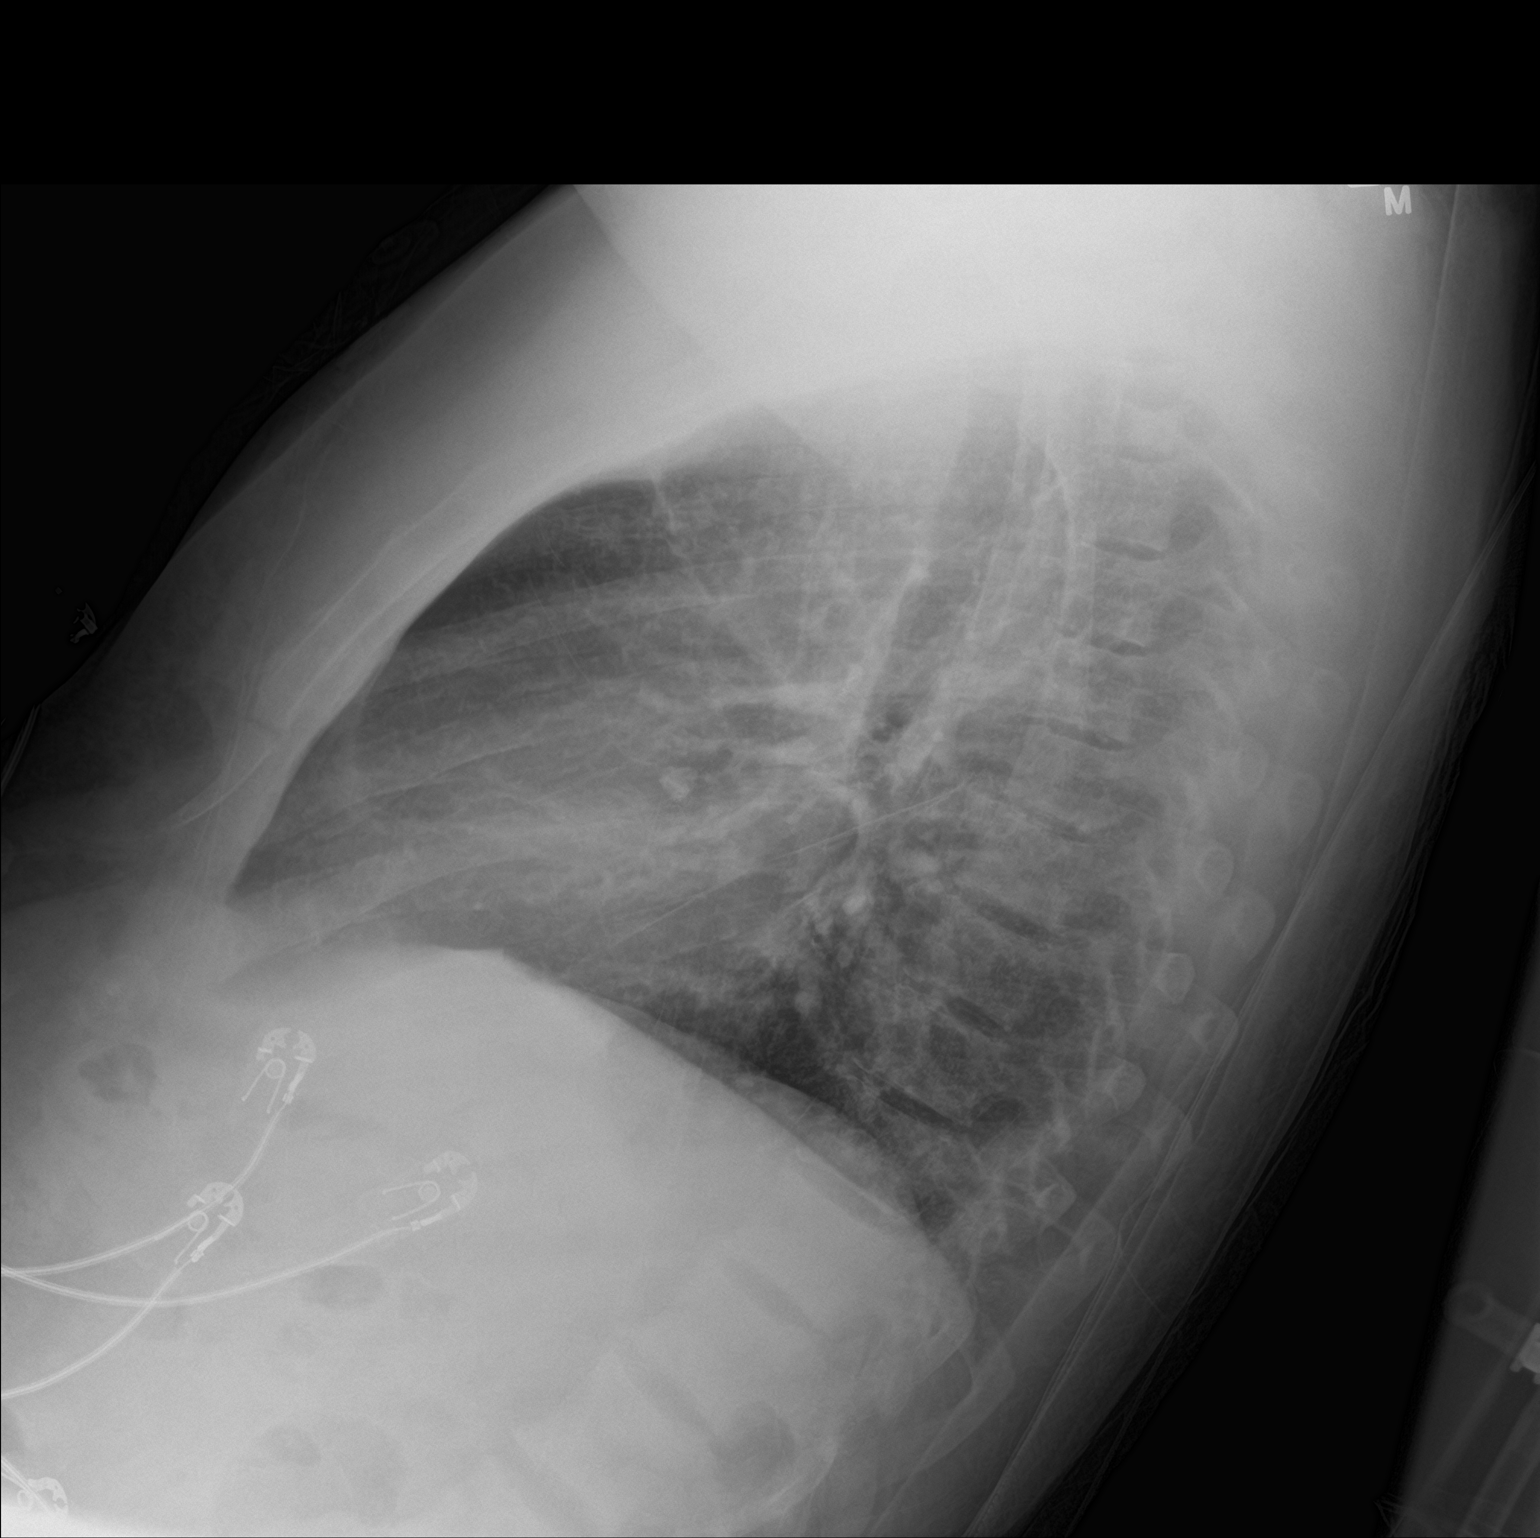

[2 of 2 positions shown; findings below may reference images not displayed]

FINDINGS: The heart size and mediastinal contours are within normal limits.
Both lungs are clear. The visualized skeletal structures are
unremarkable.
IMPRESSION: No active cardiopulmonary disease.

## 2017-05-04 IMAGING — CR DG CHEST 2V
2 series · 2 of 2 positions shown · non-contrast
Comparison: Chest radiograph dated 09/22/2015

CLINICAL DATA: 35-year-old male with hemoptysis

EXAM:
CHEST  2 VIEW

[chest lat]
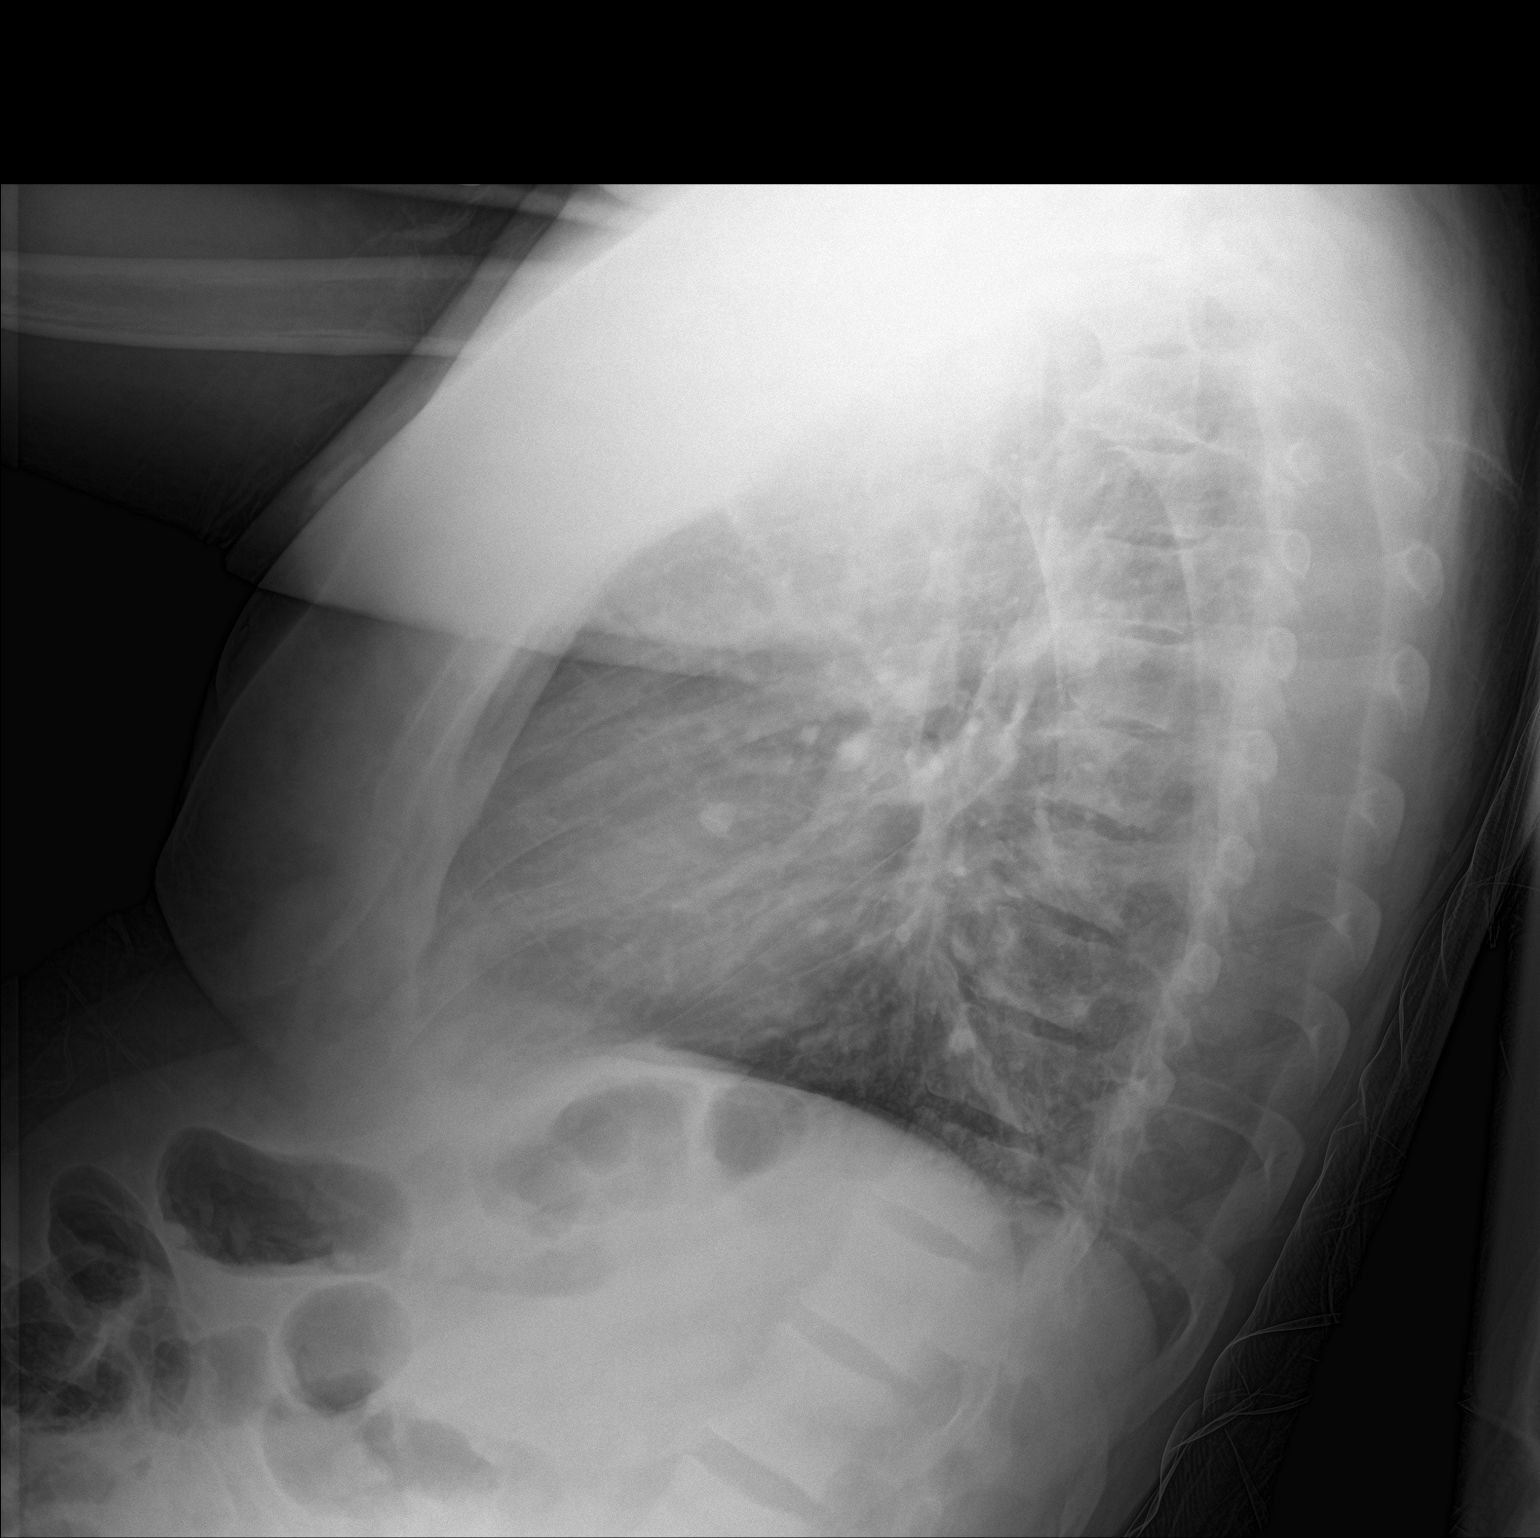

[chest ap]
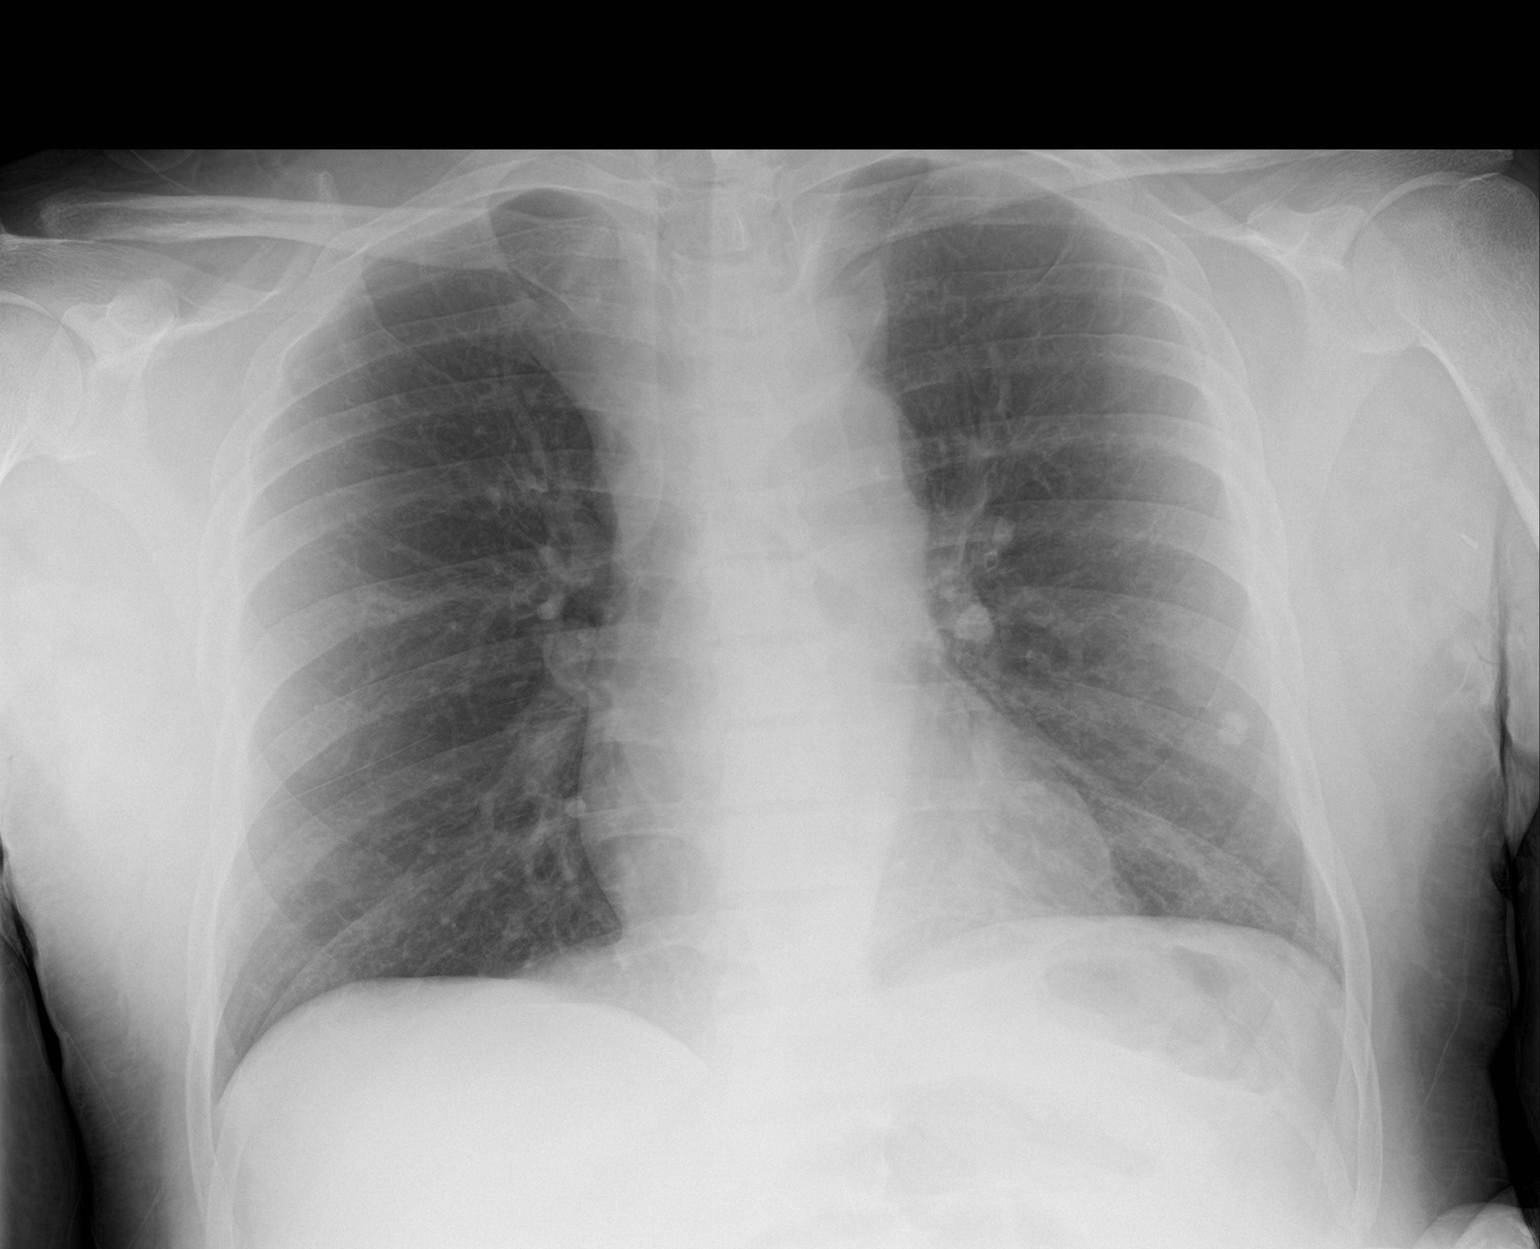

[2 of 2 positions shown; findings below may reference images not displayed]

FINDINGS: Two views of the chest demonstrate clear lungs. There is no pleural
effusion or pneumothorax. Stable left hilar and lingular calcified
granuloma. The cardiac silhouette is within normal limits. No acute
osseous pathology.
IMPRESSION: No active cardiopulmonary disease.

## 2017-05-07 IMAGING — CR DG CHEST 1V PORT
1 series · 1 of 1 positions shown · non-contrast
Comparison: September 30, 2015

CLINICAL DATA: Status post femoral dialysis catheter placement

EXAM:
PORTABLE CHEST 1 VIEW

[AP]
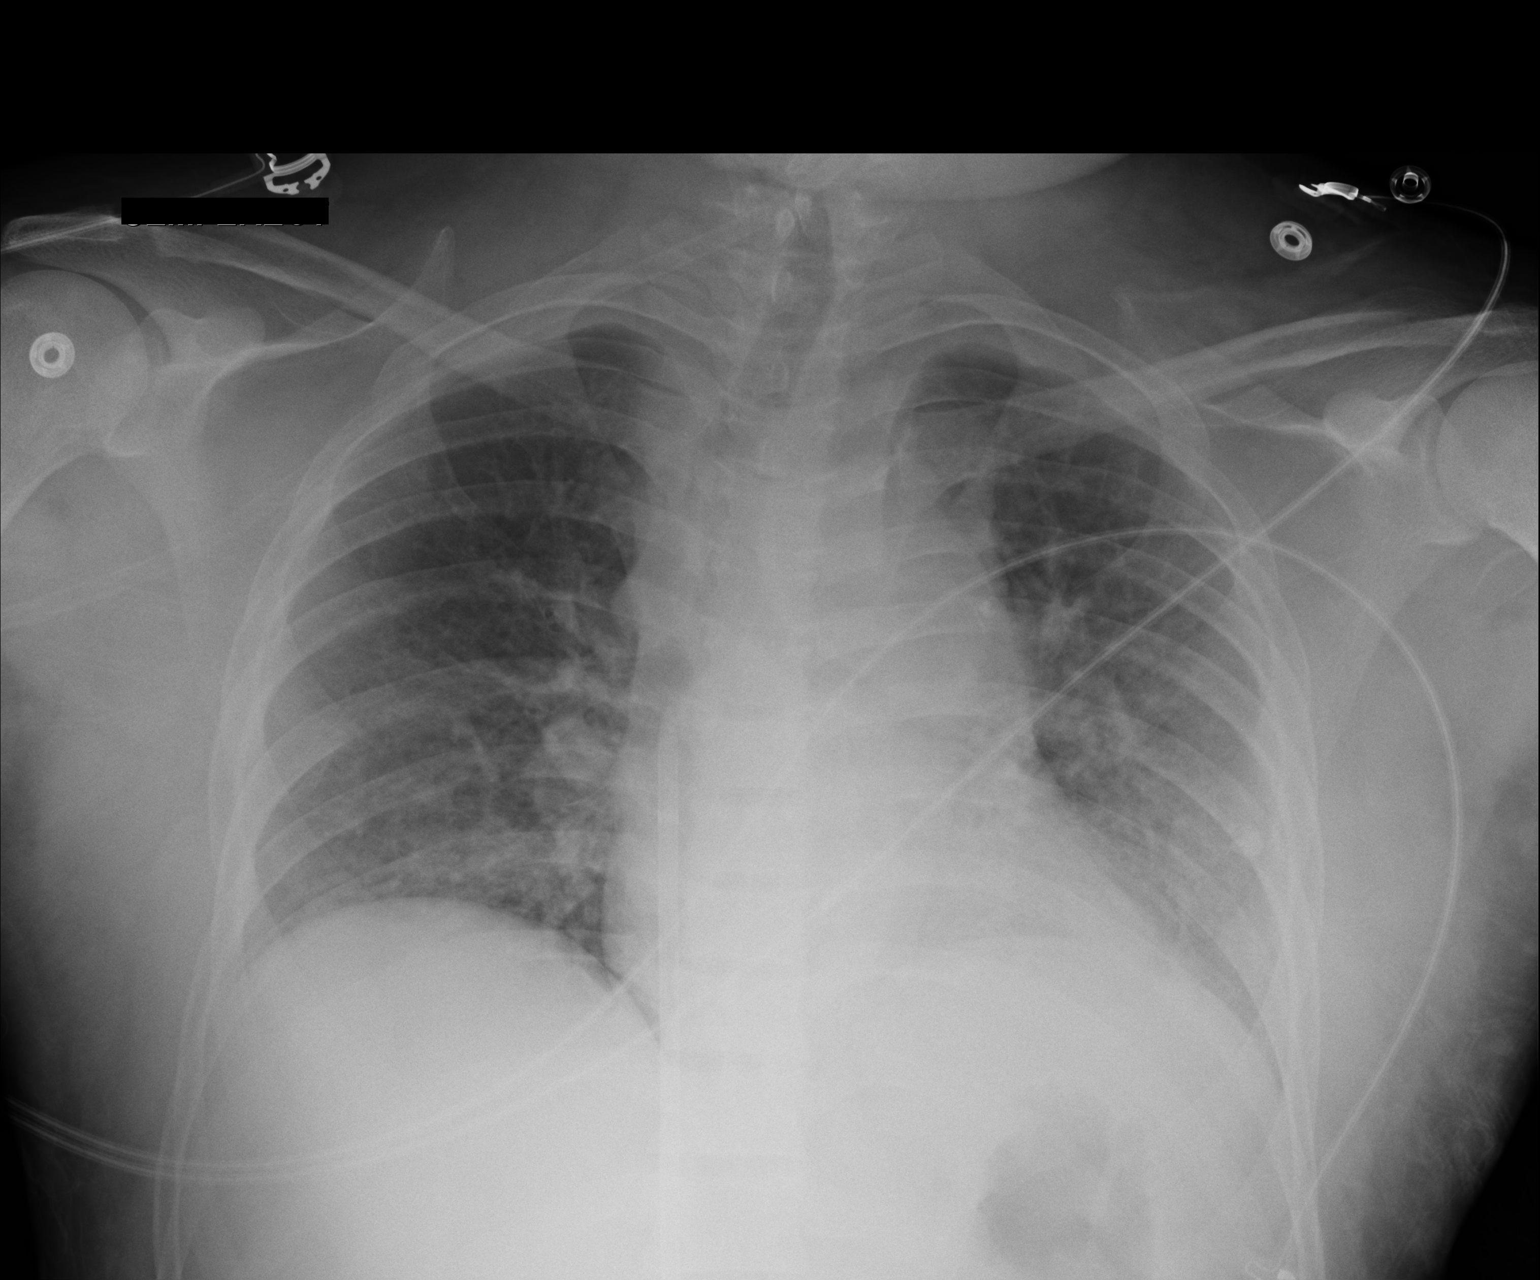

[1 of 1 positions shown; findings below may reference images not displayed]

FINDINGS: The right dialysis catheter terminates either within the right
atrium or within the central SVC. The inferior caval atrial junction
is 9 cm proximal to the distal tip. No pneumothorax. Mild pulmonary
edema. No other acute interval changes.
IMPRESSION: The right femoral catheter terminates either within the right atrium
or the central SVC. Consider withdrawing several cm. The caval
atrial junction is 9 cm proximal to the tip.

Mild edema.

These results will be called to the ordering clinician or
representative by the Radiologist Assistant, and communication
documented in the PACS or zVision Dashboard.

## 2017-06-02 ENCOUNTER — Other Ambulatory Visit: Payer: Self-pay | Admitting: Infectious Disease

## 2023-03-29 ENCOUNTER — Encounter (HOSPITAL_COMMUNITY): Payer: Self-pay | Admitting: Vascular Surgery
